# Patient Record
Sex: Male | Born: 1990 | Race: Black or African American | Hispanic: No | Marital: Single | Smoking: Current every day smoker
Health system: Southern US, Community
[De-identification: ages and names within clinical notes are randomized; demographics above are authoritative.]

## PROBLEM LIST (undated history)

## (undated) DIAGNOSIS — F329 Major depressive disorder, single episode, unspecified: Secondary | ICD-10-CM

## (undated) DIAGNOSIS — I10 Essential (primary) hypertension: Secondary | ICD-10-CM

## (undated) DIAGNOSIS — F3130 Bipolar disorder, current episode depressed, mild or moderate severity, unspecified: Secondary | ICD-10-CM

## (undated) DIAGNOSIS — F419 Anxiety disorder, unspecified: Secondary | ICD-10-CM

## (undated) DIAGNOSIS — F32A Depression, unspecified: Secondary | ICD-10-CM

---

## 2015-08-27 ENCOUNTER — Emergency Department
Admission: EM | Admit: 2015-08-27 | Discharge: 2015-08-28 | Disposition: A | Discharge status: Other Acute Inpatient Hospital | Payer: Self-pay | Attending: Student in an Organized Health Care Education/Training Program | Admitting: Student in an Organized Health Care Education/Training Program

## 2015-08-27 ENCOUNTER — Emergency Department
Admission: EM | Admit: 2015-08-27 | Discharge: 2015-08-27 | Disposition: A | Discharge status: Home / Self Care | Payer: Self-pay | Attending: Emergency Medicine | Admitting: Emergency Medicine

## 2015-08-27 ENCOUNTER — Encounter: Payer: Self-pay | Admitting: Emergency Medicine

## 2015-08-27 ENCOUNTER — Emergency Department: Payer: Self-pay

## 2015-08-27 DIAGNOSIS — I10 Essential (primary) hypertension: Secondary | ICD-10-CM

## 2015-08-27 DIAGNOSIS — F17200 Nicotine dependence, unspecified, uncomplicated: Secondary | ICD-10-CM | POA: Insufficient documentation

## 2015-08-27 DIAGNOSIS — F418 Other specified anxiety disorders: Secondary | ICD-10-CM | POA: Insufficient documentation

## 2015-08-27 DIAGNOSIS — Z79899 Other long term (current) drug therapy: Secondary | ICD-10-CM | POA: Insufficient documentation

## 2015-08-27 DIAGNOSIS — R45851 Suicidal ideations: Secondary | ICD-10-CM | POA: Insufficient documentation

## 2015-08-27 DIAGNOSIS — F1220 Cannabis dependence, uncomplicated: Secondary | ICD-10-CM

## 2015-08-27 DIAGNOSIS — G8929 Other chronic pain: Secondary | ICD-10-CM | POA: Insufficient documentation

## 2015-08-27 DIAGNOSIS — M25569 Pain in unspecified knee: Secondary | ICD-10-CM

## 2015-08-27 DIAGNOSIS — M25561 Pain in right knee: Secondary | ICD-10-CM | POA: Insufficient documentation

## 2015-08-27 DIAGNOSIS — R4689 Other symptoms and signs involving appearance and behavior: Secondary | ICD-10-CM

## 2015-08-27 DIAGNOSIS — F918 Other conduct disorders: Secondary | ICD-10-CM | POA: Insufficient documentation

## 2015-08-27 DIAGNOSIS — Z9119 Patient's noncompliance with other medical treatment and regimen: Secondary | ICD-10-CM | POA: Insufficient documentation

## 2015-08-27 HISTORY — DX: Major depressive disorder, single episode, unspecified: F32.9

## 2015-08-27 HISTORY — DX: Bipolar disorder, current episode depressed, mild or moderate severity, unspecified: F31.30

## 2015-08-27 HISTORY — DX: Anxiety disorder, unspecified: F41.9

## 2015-08-27 HISTORY — DX: Depression, unspecified: F32.A

## 2015-08-27 HISTORY — DX: Essential (primary) hypertension: I10

## 2015-08-27 LAB — COMPREHENSIVE METABOLIC PANEL
ALBUMIN: 5.1 g/dL — AB (ref 3.5–5.0)
ALT: 20 U/L (ref 17–63)
AST: 20 U/L (ref 15–41)
Alkaline Phosphatase: 47 U/L (ref 38–126)
Anion gap: 4 — ABNORMAL LOW (ref 5–15)
BUN: 13 mg/dL (ref 6–20)
CHLORIDE: 104 mmol/L (ref 101–111)
CO2: 31 mmol/L (ref 22–32)
Calcium: 9.9 mg/dL (ref 8.9–10.3)
Creatinine, Ser: 0.89 mg/dL (ref 0.61–1.24)
GFR calc Af Amer: >60 mL/min (ref >60)
GFR calc non Af Amer: >60 mL/min (ref >60)
GLUCOSE: 88 mg/dL (ref 65–99)
POTASSIUM: 3.8 mmol/L (ref 3.5–5.1)
Sodium: 139 mmol/L (ref 135–145)
Total Bilirubin: 0.9 mg/dL (ref 0.3–1.2)
Total Protein: 8.6 g/dL — ABNORMAL HIGH (ref 6.5–8.1)

## 2015-08-27 LAB — SALICYLATE LEVEL: Salicylate Lvl: <4 mg/dL (ref 2.8–30.0)

## 2015-08-27 LAB — CBC
HEMATOCRIT: 44.6 % (ref 40.0–52.0)
Hemoglobin: 14 g/dL (ref 13.0–18.0)
MCH: 27 pg (ref 26.0–34.0)
MCHC: 31.3 g/dL — ABNORMAL LOW (ref 32.0–36.0)
MCV: 86.3 fL (ref 80.0–100.0)
Platelets: 217 10*3/uL (ref 150–440)
RBC: 5.18 MIL/uL (ref 4.40–5.90)
RDW: 13.2 % (ref 11.5–14.5)
WBC: 9.5 10*3/uL (ref 3.8–10.6)

## 2015-08-27 LAB — ACETAMINOPHEN LEVEL: Acetaminophen (Tylenol), Serum: <10 ug/mL — ABNORMAL LOW (ref 10–30)

## 2015-08-27 LAB — ETHANOL: Alcohol, Ethyl (B): <5 mg/dL (ref <5)

## 2015-08-27 MED ORDER — HYDROMORPHONE HCL 1 MG/ML IJ SOLN
INTRAMUSCULAR | Status: AC
  Administered 2015-08-27: 1 mg via INTRAMUSCULAR
  Filled 2015-08-27: qty 1

## 2015-08-27 MED ORDER — HYDROMORPHONE HCL 1 MG/ML IJ SOLN
1.0000 mg | Freq: Once | INTRAMUSCULAR | Status: AC
Start: 2015-08-27 — End: 2015-08-27
  Administered 2015-08-27: 1 mg via INTRAMUSCULAR

## 2015-08-27 MED ORDER — MELOXICAM 15 MG PO TABS: 15.0000 mg | ORAL_TABLET | Freq: Every day | ORAL | 2 refills | Status: DC

## 2015-08-27 NOTE — ED Notes (Signed)
Patient brought to [facility] in street clothes and with cell phone. Butch RN present to change the patient and pack belongings.

## 2015-08-27 NOTE — ED Triage Notes (Signed)
Presents with pain to right knee  Describes pain as sharp and spasm like  Hx of same in past but states this time his knee is "locked" up

## 2015-08-27 NOTE — ED Provider Notes (Signed)
[facility] Emergency Department Provider Note  ____________________________________________   First MD Initiated Contact with Patient 08/27/15 1332     (approximate)  I have reviewed the triage vital signs and the nursing notes.   HISTORY  Chief Complaint Knee Pain   HPI Philip Norris is a 25 y.o. male is here complaining of right knee pain. Patient states that he has not had any recent injury. He has been having pain in his right knee for over 6 months. He states he has not seen anyone prior to this visit for his knee pain. He has not been taking any over-the-counter medication for his knee as he "does not like taking medication". He also discontinued taking his anxiety and depression medicine without discussion with his doctor as he decided he did not like taking that medication as well. He states that occasionally he does have some anxiety. 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 Currently he rates his pain as an 8/10. Pain is increased with weightbearing. Currently patient states that he cannot straighten his knee out without severe pain.   Past Medical History:  Diagnosis Date  . Anxiety   . Depression   . Hypertension     There are no active problems to display for this patient.   History reviewed. No pertinent surgical history.  Prior to Admission medications   Medication Sig Start Date End Date Taking? Authorizing Provider  meloxicam (MOBIC) 15 MG tablet Take 1 tablet (15 mg total) by mouth daily. 08/27/15 08/26/16  Tommi Rumpshonda L Summers, PA-C    Allergies Review of patient's allergies indicates no known allergies.  No family history on file.  Social History Social History  Substance Use Topics  . Smoking status: Current Every Day Smoker  . Smokeless tobacco: Never Used  . Alcohol use Yes    Review of Systems Constitutional: No fever/chills Cardiovascular: Denies chest pain. Respiratory: Denies shortness  of breath. Gastrointestinal: No abdominal pain.  No nausea, no vomiting.   Musculoskeletal: Negative for back pain. Positive for chronic right knee pain. Skin: Negative for rash. Neurological: Negative for headaches, focal weakness or numbness. Psychiatric:Positive for anxiety and depression.  10-point ROS otherwise negative.  ____________________________________________   PHYSICAL EXAM:  VITAL SIGNS: ED Triage Vitals  Enc Vitals Group     BP 08/27/15 1303 (!) 136/100     Pulse Rate 08/27/15 1303 77     Resp 08/27/15 1303 20     Temp 08/27/15 1303 98.7 F (37.1 C)     Temp Source 08/27/15 1303 Oral     SpO2 08/27/15 1303 99 %     Weight 08/27/15 1312 233 lb (105.7 kg)     Height 08/27/15 1312 6\' 3"  (1.905 m)     Head Circumference --      Peak Flow --      Pain Score 08/27/15 1301 8     Pain Loc --      Pain Edu? --      Excl. in GC? --     Constitutional: Alert and oriented. Well appearing and in no acute distress. Eyes: Conjunctivae are normal. PERRL. EOMI. Head: Atraumatic. Nose: No congestion/rhinnorhea. Neck: No stridor.   Cardiovascular: Normal rate, regular rhythm. Grossly normal heart sounds.  Good peripheral circulation. Respiratory: Normal respiratory effort.  No retractions. Lungs CTAB. Gastrointestinal: Soft and nontender. No distention. Musculoskeletal: On examination of the right knee currently patient has it flexed in his wheelchair. There is  tenderness on palpation of the knee posteriorly. No gross deformity is noted and no effusion is present anteriorly. Area is not warm or erythematous on exam. Patient currently is guarding movement. Neurologic:  Normal speech and language. No gross focal neurologic deficits are appreciated. No gait instability. Skin:  Skin is warm, dry and intact. No rash noted. No ecchymosis, erythema, or abrasions were noted. Psychiatric: Mood and affect are normal. Speech and behavior are  normal.  ____________________________________________   LABS (all labs ordered are listed, but only abnormal results are displayed)  Labs Reviewed - No data to display ____________________________________________  RADIOLOGY Per radiologist FINDINGS: No evidence of fracture, dislocation, or joint effusion. No evidence of arthropathy or other focal bone abnormality. Soft tissues are unremarkable.  I, Tommi Rumpshonda L Summers, personally viewed and evaluated these images (plain radiographs) as part of my medical decision making, as well as reviewing the written report by the radiologist. _______________________________________   PROCEDURES  Procedure(s) performed: None  Procedures  Critical Care performed: No  ____________________________________________   INITIAL IMPRESSION / ASSESSMENT AND PLAN / ED COURSE  Pertinent labs & imaging results that were available during my care of the patient were reviewed by me and considered in my medical decision making (see chart for details).    Clinical Course    Patient was given Dilaudid in the emergency room in order to get adequate x-rays of his right knee. Patient agrees to take medication for his right knee and meloxicam 15 mg 1 daily with food was given to him at no refill. Patient is to wear a knee immobilizer for support and make an appointment with Dr. Joice LoftsPoggi for his chronic knee pain. ____________________________________________   FINAL CLINICAL IMPRESSION(S) / ED DIAGNOSES  Final diagnoses:  Chronic knee pain, right      NEW MEDICATIONS STARTED DURING THIS VISIT:  New Prescriptions   MELOXICAM (MOBIC) 15 MG TABLET    Take 1 tablet (15 mg total) by mouth daily.     Note:  This document was prepared using Dragon voice recognition software and may include unintentional dictation errors.    Tommi RumpsRhonda L Summers, PA-C 08/27/15 1503    Philip Blazeravid Matthew Schaevitz, MD 08/27/15 [phone]

## 2015-08-27 NOTE — ED Notes (Signed)
Butch RN dressed pt out in bathroom. Pt unable to give UA sample at this time.  Pt aware of need and will give one as soon as possible

## 2015-08-27 NOTE — Discharge Instructions (Signed)
Follow-up with Dr. Joice LoftsPoggi if any continued problems. Call his office and make an appointment. Wear knee immobilizer for walking. He did not need to wear this while sleeping.

## 2015-08-27 NOTE — BH Assessment (Signed)
Assessment Note  Philip Norris is an 25 y.o. male. [facility].  He reports that he "cussed her out real good".  He states that he asked her for his medication, and she would not give it to him.  He reports that his mother and grandmother have an issue with him.  He reports that his grandmother was upset, and that this is what his family does when they get upset with him.  He states that his family likes to provoke him and then contact the police to get him.  He states that the family will make determination on what he should be doing.  He states that his family will provoke him and then make it seem as though he is the problem.  He states that he tries to stay away from them.  He states that his family does not listen to understand him.  He denied symptoms of depression or anxiety. He denied having auditory or visual hallucinations.  He denied suicidal or homicidal ideation or intent. He denied alcohol abuse. He reports the use of marijuana. He denied many stressors, but admits that he is stressed by his grandmother who treats him like a puppet. He states that he is currently working at [name] full time. IVC paperwork reports "Hist Bipolar disorder; non-compliance w/psych meds; has expressed desire to end his life. TTS contacted his mother Chilton Greathouse(Maye Riddick [phone]). She reports that [place] had come to the ED due to leg cramps earlier in the day.  He was discharged with an immobilizer and a prescription.   Mother reports that He has behavioral issues.  He is reported as having a "terrible outburst" this morning. She reports that these behaviors are sporadic.  She states that Philip Norris is not taking his medications. Mother states that he is "threatening and belligerent".    Diagnosis: Bipolar,   Past Medical History:  Past Medical History:  Diagnosis Date  . Anxiety   . Bipolar affect, depressed (HCC)   . Depression   . Hypertension     History  reviewed. No pertinent surgical history.  Family History: No family history on file.  Social History:  reports that he has been smoking.  He has never used smokeless tobacco. He reports that he drinks alcohol. His drug history is not on file.  Additional Social History:  Alcohol / Drug Use History of alcohol / drug use?: No history of alcohol / drug abuse  CIWA: CIWA-Ar BP: (!) 148/83 Pulse Rate: 82 COWS:    Allergies: No Known Allergies  Home Medications:  (Not in a hospital admission)  OB/GYN Status:  No LMP for male patient.  General Assessment Data [facility] Assessment: In system Is this a Tele or Face-to-Face Assessment?: Face-to-Face Is this an Initial Assessment or a Re-assessment for this encounter?: Initial Assessment Marital status: Single Maiden name: n/a Is patient pregnant?: No Pregnancy Status: No Living Arrangements: Parent, Other relatives Can pt return to current living arrangement?: Yes Admission Status: Involuntary Is patient capable of signing voluntary admission?: Yes Referral Source: Self/Family/Friend Insurance type: None  Medical Screening Exam [facility] Walk-in ONLY) Medical Exam completed: Yes  Crisis Care Plan Living Arrangements: Parent, Other relatives Legal Guardian: Other: (Self) Name of Psychiatrist: [facility] Name of Therapist: [facility]  Education Status Is patient currently in school?: No Current Grade: n/a Highest grade of school patient has completed: 9th Name of school: [employer] Contact person: n/a  Risk to self with the past 6 months Suicidal Ideation: No Has patient been a risk to self within the past 6 months prior to admission? : No Suicidal Intent: No Has patient had any suicidal intent within the past 6 months prior to admission? : No Is patient at risk for suicide?: No Suicidal Plan?: No Has patient had any suicidal plan within the past 6 months prior to admission? : No Access to Means: No What has  been your use of drugs/alcohol within the last 12 months?: Use of marijuana Previous Attempts/Gestures: No How many times?: 0 Other Self Harm Risks: denied Triggers for Past Attempts: None known Intentional Self Injurious Behavior: None Family Suicide History: No Recent stressful life event(s): Conflict (Comment) (family stressors) Persecutory voices/beliefs?: No Depression: No Depression Symptoms:  (denied) Substance abuse history and/or treatment for substance abuse?: No Suicide prevention information given to non-admitted patients: Not applicable  Risk to Others within the past 6 months Homicidal Ideation: No Does patient have any lifetime risk of violence toward others beyond the six months prior to admission? : No Thoughts of Harm to Others: No Current Homicidal Intent: No Current Homicidal Plan: No Access to Homicidal Means: No Identified Victim: none History of harm to others?: No Assessment of Violence: None Noted Violent Behavior Description: denied Does patient have access to weapons?: No Criminal Charges Pending?: No Does patient have a court date: No Is patient on probation?: No  Psychosis Hallucinations: None noted Delusions: None noted  Mental Status Report Appearance/Hygiene: In scrubs, Unremarkable Eye Contact: Good Motor Activity: Unremarkable Speech: Logical/coherent Level of Consciousness: Alert Mood: Irritable Affect: Appropriate to circumstance Anxiety Level: None Thought Processes: Coherent Judgement: Unimpaired Orientation: Person, Place, Time, Situation Obsessive Compulsive Thoughts/Behaviors: None  Cognitive Functioning Concentration: Normal Memory: Recent Intact IQ: Average Insight: Fair Impulse Control: Fair Appetite: Fair Sleep: No Change Vegetative Symptoms: None  ADLScreening [facility] Assessment Services) Patient's cognitive ability adequate to safely complete daily activities?: Yes Patient able to express need for assistance with  ADLs?: Yes Independently performs ADLs?: Yes (appropriate for developmental age)  Prior Inpatient Therapy Prior Inpatient Therapy: No Prior Therapy Dates: n/a Prior Therapy Facilty/Provider(s): n/a Reason for Treatment: n/a  Prior Outpatient Therapy Prior Outpatient Therapy: Yes Prior Therapy Dates: Current Prior Therapy Facilty/Provider(s): [facility] Reason for Treatment: anxiety, Bipolar, schizophrenia Does patient have an ACCT team?: No Does patient have Intensive In-House Services?  : No Does patient have [facility] services? : No Does patient have P4CC services?: No  ADL Screening (condition at time of admission) Patient's cognitive ability adequate to safely complete daily activities?: Yes Patient able to express need for assistance with ADLs?: Yes Independently performs ADLs?: Yes (appropriate for developmental age)       Abuse/Neglect Assessment (Assessment to be complete while patient is alone) Physical Abuse: Denies Verbal Abuse: Denies Sexual Abuse: Denies Exploitation of patient/patient's resources: Denies Self-Neglect: Denies     Merchant navy officerAdvance Directives (For Healthcare) Does patient have an advance directive?: No Would patient like information on creating an advanced directive?: No - patient declined information    Additional Information 1:1 In Past 12 Months?: No CIRT Risk: No Elopement Risk: No Does patient have medical clearance?: Yes     Disposition:  Disposition Initial Assessment Completed for this Encounter: Yes Disposition of Patient: Other dispositions  On Site Evaluation by:   Reviewed with Physician:    Justice DeedsKeisha Malike Foglio 08/27/2015 10:31 PM

## 2015-08-27 NOTE — ED Triage Notes (Signed)
Pt presents to ED via wheelchair with [place] [employer]. Pt is under IVC, pt has hx of bipolar disorder. Pt is non-compliant with psych meds, pt reports was "yelling and cussing at grandmother." Pt calm and cooperative at this time. Pt denies SI or HI at this time.

## 2015-08-27 NOTE — ED Notes (Signed)
[facility]

## 2015-08-27 NOTE — ED Provider Notes (Signed)
[facility] Emergency Department Provider Note    First MD Initiated Contact with Patient 08/27/15 2117     (approximate)  I have reviewed the triage vital signs and the nursing notes.   HISTORY  Chief Complaint Psychiatric Evaluation    HPI Philip Norris is a 25 y.o. male with a history of bipolar disorder depression and anxiety presents as an IVC after having aggressive behavior towards his grandmother tonight. Patient states he does not take any antipsychotic medications currently denies any suicidal ideation or homicidal ideation. States he's been off his medications for several months. States he does not like the way they make him feel. He denies any hallucinations. States that he was angry towards his grandmother today and she he felt that she was being "petty" and tried to divert his medications that he was prescribed for knee pain after being seen in the ER today. That he was yelling at her but did not become physically aggressive with her.   Past Medical History:  Diagnosis Date  . Anxiety   . Bipolar affect, depressed (HCC)   . Depression   . Hypertension     There are no active problems to display for this patient.   History reviewed. No pertinent surgical history.  Prior to Admission medications   Medication Sig Start Date End Date Taking? Authorizing Provider  meloxicam (MOBIC) 15 MG tablet Take 1 tablet (15 mg total) by mouth daily. 08/27/15 08/26/16  Tommi Rumpshonda L Summers, PA-C    Allergies Review of patient's allergies indicates no known allergies.  No family history on file.  Social History Social History  Substance Use Topics  . Smoking status: Current Every Day Smoker  . Smokeless tobacco: Never Used  . Alcohol use Yes    Review of Systems Patient denies headaches, rhinorrhea, blurry vision, numbness, shortness of breath, chest pain, edema, cough, abdominal pain, nausea, vomiting, diarrhea, dysuria, fevers, rashes or  hallucinations unless otherwise stated above in HPI. ____________________________________________   PHYSICAL EXAM:  VITAL SIGNS: Vitals:   08/27/15 2102  BP: (!) 148/83  Pulse: 82  Resp: 18  Temp: 98.8 F (37.1 C)    Constitutional: Alert and oriented. Well appearing and in no acute distress. Eyes: Conjunctivae are normal. PERRL. EOMI. Head: Atraumatic. Nose: No congestion/rhinnorhea. Mouth/Throat: Mucous membranes are moist.  Oropharynx non-erythematous. Neck: No stridor. Painless ROM. No cervical spine tenderness to palpation Hematological/Lymphatic/Immunilogical: No cervical lymphadenopathy. Cardiovascular: Normal rate, regular rhythm. Grossly normal heart sounds.  Good peripheral circulation. Respiratory: Normal respiratory effort.  No retractions. Lungs CTAB. Gastrointestinal: Soft and nontender. No distention. No abdominal bruits. No CVA tenderness. Genitourinary:  Musculoskeletal: No lower extremity tenderness nor edema.  No joint effusions. Neurologic:  Normal speech and language. No gross focal neurologic deficits are appreciated. No gait instability. Skin:  Skin is warm, dry and intact. No rash noted. Psychiatric: Mood and affect are normal. Speech and behavior are normal.  ____________________________________________   LABS (all labs ordered are listed, but only abnormal results are displayed)  Results for orders placed or performed during the hospital encounter of 08/27/15 (from the past 24 hour(s))  Comprehensive metabolic panel     Status: Abnormal   Collection Time: 08/27/15  9:30 PM  Result Value Ref Range   Sodium 139 135 - 145 mmol/L   Potassium 3.8 3.5 - 5.1 mmol/L   Chloride 104 101 - 111 mmol/L   CO2 31 22 - 32 mmol/L   Glucose, Bld 88 65 - 99 mg/dL  BUN 13 6 - 20 mg/dL   Creatinine, Ser 1.610.89 0.61 - 1.24 mg/dL   Calcium 9.9 8.9 - 09.610.3 mg/dL   Total Protein 8.6 (H) 6.5 - 8.1 g/dL   Albumin 5.1 (H) 3.5 - 5.0 g/dL   AST 20 15 - 41 U/L   ALT 20 17  - 63 U/L   Alkaline Phosphatase 47 38 - 126 U/L   Total Bilirubin 0.9 0.3 - 1.2 mg/dL   GFR calc non Af Amer >60 >60 mL/min   GFR calc Af Amer >60 >60 mL/min   Anion gap 4 (L) 5 - 15  cbc     Status: Abnormal   Collection Time: 08/27/15  9:30 PM  Result Value Ref Range   WBC 9.5 3.8 - 10.6 K/uL   RBC 5.18 4.40 - 5.90 MIL/uL   Hemoglobin 14.0 13.0 - 18.0 g/dL   HCT 04.544.6 40.940.0 - 81.152.0 %   MCV 86.3 80.0 - 100.0 fL   MCH 27.0 26.0 - 34.0 pg   MCHC 31.3 (L) 32.0 - 36.0 g/dL   RDW 91.413.2 78.211.5 - 95.614.5 %   Platelets 217 150 - 440 K/uL   ____________________________________________  ________________________________   ____________________________________________   PROCEDURES  Procedure(s) performed: none    Critical Care performed: no ____________________________________________   INITIAL IMPRESSION / ASSESSMENT AND PLAN / ED COURSE  Pertinent labs & imaging results that were available during my care of the patient were reviewed by me and considered in my medical decision making (see chart for details).  DDX: SI, HI, aggressive behavior, psychosis,  Philip LombardQuintin Thome is a 25 y.o. who presents to the ED with aggressive behavior and history of bipolar disorder. Based on his history agitated behavior and aggression towards family members I will consult psychiatry for further evaluation and management. Do not feel that his presentation represents an underlying organic process at this time. Patient otherwise medically clear for psychiatric admission.  Have discussed with the patient and available family all diagnostics and treatments performed thus far and all questions were answered to the best of my ability. The patient demonstrates understanding and agreement with plan.   Clinical Course     ____________________________________________   FINAL CLINICAL IMPRESSION(S) / ED DIAGNOSES  Final diagnoses:  Aggressive behavior  Suicidal ideation      NEW MEDICATIONS STARTED  DURING THIS VISIT:  New Prescriptions   No medications on file     Note:  This document was prepared using Dragon voice recognition software and may include unintentional dictation errors.    Willy EddyPatrick Shatira Dobosz, MD 08/28/15 [phone]

## 2015-08-28 ENCOUNTER — Inpatient Hospital Stay
Admission: EM | Admit: 2015-08-28 | Discharge: 2015-08-30 | DRG: 885 | Disposition: A | Discharge status: Home / Self Care | Payer: Self-pay | Source: Intra-hospital | Attending: Psychiatry | Admitting: Psychiatry

## 2015-08-28 DIAGNOSIS — Z9119 Patient's noncompliance with other medical treatment and regimen: Secondary | ICD-10-CM

## 2015-08-28 DIAGNOSIS — M25569 Pain in unspecified knee: Secondary | ICD-10-CM | POA: Diagnosis present

## 2015-08-28 DIAGNOSIS — F1220 Cannabis dependence, uncomplicated: Secondary | ICD-10-CM | POA: Diagnosis present

## 2015-08-28 DIAGNOSIS — F3130 Bipolar disorder, current episode depressed, mild or moderate severity, unspecified: Secondary | ICD-10-CM | POA: Diagnosis present

## 2015-08-28 DIAGNOSIS — F319 Bipolar disorder, unspecified: Principal | ICD-10-CM | POA: Diagnosis present

## 2015-08-28 DIAGNOSIS — G4700 Insomnia, unspecified: Secondary | ICD-10-CM | POA: Diagnosis present

## 2015-08-28 DIAGNOSIS — F17200 Nicotine dependence, unspecified, uncomplicated: Secondary | ICD-10-CM | POA: Diagnosis present

## 2015-08-28 DIAGNOSIS — F17210 Nicotine dependence, cigarettes, uncomplicated: Secondary | ICD-10-CM | POA: Diagnosis present

## 2015-08-28 DIAGNOSIS — I10 Essential (primary) hypertension: Secondary | ICD-10-CM | POA: Diagnosis present

## 2015-08-28 LAB — URINE DRUG SCREEN, QUALITATIVE (ARMC ONLY)
Amphetamines, Ur Screen: NOT DETECTED
BARBITURATES, UR SCREEN: NOT DETECTED
BENZODIAZEPINE, UR SCRN: NOT DETECTED
Cannabinoid 50 Ng, Ur ~~LOC~~: POSITIVE — AB
Cocaine Metabolite,Ur ~~LOC~~: NOT DETECTED
MDMA (Ecstasy)Ur Screen: NOT DETECTED
Methadone Scn, Ur: NOT DETECTED
OPIATE, UR SCREEN: NOT DETECTED
PHENCYCLIDINE (PCP) UR S: NOT DETECTED
Tricyclic, Ur Screen: NOT DETECTED

## 2015-08-28 MED ORDER — MELOXICAM 7.5 MG PO TABS
15.0000 mg | ORAL_TABLET | Freq: Every day | ORAL | Status: DC
Start: 2015-08-28 — End: 2015-08-28
  Administered 2015-08-28 (×2): 15 mg via ORAL
  Filled 2015-08-28 (×2): qty 2

## 2015-08-28 MED ORDER — ACETAMINOPHEN 325 MG PO TABS
650.0000 mg | ORAL_TABLET | Freq: Four times a day (QID) | ORAL | Status: DC | PRN
  Administered 2015-08-29 (×2): 650 mg via ORAL
  Filled 2015-08-28 (×2): qty 2

## 2015-08-28 MED ORDER — ALUM & MAG HYDROXIDE-SIMETH 200-200-20 MG/5ML PO SUSP: 30.0000 mL | ORAL | Status: DC | PRN

## 2015-08-28 MED ORDER — MAGNESIUM HYDROXIDE 400 MG/5ML PO SUSP: 30.0000 mL | Freq: Every day | ORAL | Status: DC | PRN

## 2015-08-28 MED ORDER — ACETAMINOPHEN 325 MG PO TABS
650.0000 mg | ORAL_TABLET | ORAL | Status: DC | PRN
  Administered 2015-08-28 (×2): 650 mg via ORAL
  Filled 2015-08-28 (×2): qty 2

## 2015-08-28 MED ORDER — HYDROXYZINE HCL 25 MG PO TABS
25.0000 mg | ORAL_TABLET | Freq: Four times a day (QID) | ORAL | Status: DC | PRN
  Administered 2015-08-28 (×2): 25 mg via ORAL
  Filled 2015-08-28 (×2): qty 1

## 2015-08-28 NOTE — Consult Note (Signed)
[facility] Face-to-Face Psychiatry Consult   Reason for Consult:  Consult for this 25 year old man brought to the hospital under IVC because of aggression at home. Referring Physician:  Eual Fines Patient Identification: Philip Norris MRN:  [id number] Principal Diagnosis: Bipolar affect, depressed ([place]) Diagnosis:   Patient Active Problem List   Diagnosis Date Noted  . Bipolar affect, depressed ([place]) [F31.30] 08/28/2015  . Hypertension [I10] 08/28/2015  . Cannabis abuse [F12.10] 08/28/2015  . Knee pain [M25.569] 08/28/2015    Total Time spent with patient: 1 hour  Subjective:   Philip Norris is a 25 y.o. male patient admitted with "my grandmother sent me here".  HPI:  Patient interviewed. Chart reviewed. Labs and vitals reviewed. This 25 year old man with a history of mood problems came to the emergency room yesterday for acute pain in his knee. He was given some nonsteroidal medicine and sent home. At home apparently he got into an argument with his grandmother over how much of the pain medicine she would give him. He reportedly lost his temper and became very hostile and verbally abusive towards his grandmother. Patient denies that he was physically aggressive. Denies that he made any threats to anyone and denies any suicidal thoughts. He admits that his mood is up and down at times and that he can lose his temper easily. He is not currently taking any psychiatric medicine. He says that his last drink of alcohol was a couple weeks ago but he uses marijuana on a daily basis. The commitment paperwork describes him as having a diagnosis of bipolar disorder and saying that he is labile and has been noncompliant with medication.  Social history: Lives with his grandmother. Says that he works at [employer] at night. Dropped out of school in ninth grade.  Medical history: No known medical problems other than a past diagnosis of hypertension.  Substance abuse history: History of daily use  of marijuana. Denies other drugs. He drinks alcohol but his last drink was a couple weeks ago.  Past Psychiatric History: Patient has been going to [place] and seeing Dr. Jacqualine Code but hasn't followed up in a couple months. History of a diagnosis of bipolar disorder. Unclear which medicines he has been on in the past. He tells me trazodone was the only thing he was taking although there is some report in some of the paperwork that he had been on Depakote at one point. Denies any history of suicide attempts.  Risk to Self: Suicidal Ideation: No Suicidal Intent: No Is patient at risk for suicide?: No Suicidal Plan?: No Access to Means: No What has been your use of drugs/alcohol within the last 12 months?: Use of marijuana How many times?: 0 Other Self Harm Risks: denied Triggers for Past Attempts: None known Intentional Self Injurious Behavior: None Risk to Others: Homicidal Ideation: No Thoughts of Harm to Others: No Current Homicidal Intent: No Current Homicidal Plan: No Access to Homicidal Means: No Identified Victim: none History of harm to others?: No Assessment of Violence: None Noted Violent Behavior Description: denied Does patient have access to weapons?: No Criminal Charges Pending?: No Does patient have a court date: No Prior Inpatient Therapy: Prior Inpatient Therapy: No Prior Therapy Dates: n/a Prior Therapy Facilty/Provider(s): n/a Reason for Treatment: n/a Prior Outpatient Therapy: Prior Outpatient Therapy: Yes Prior Therapy Dates: Current Prior Therapy Facilty/Provider(s): [facility] Reason for Treatment: anxiety, Bipolar, schizophrenia Does patient have an ACCT team?: No Does patient have Intensive In-House Services?  : No Does patient have [facility] services? :  No Does patient have P4CC services?: No  Past Medical History:  Past Medical History:  Diagnosis Date  . Anxiety   . Bipolar affect, depressed ([place])   . Depression   . Hypertension    History reviewed. No  pertinent surgical history. Family History: No family history on file. Family Psychiatric  History: Patient says he has a history of depression but no history of suicide in the family Social History:  History  Alcohol Use  . Yes     History  Drug use: Unknown    Social History   Social History  . Marital status: Single    Spouse name: N/A  . Number of children: N/A  . Years of education: N/A   Social History Main Topics  . Smoking status: Current Every Day Smoker  . Smokeless tobacco: Never Used  . Alcohol use Yes  . Drug use: Unknown  . Sexual activity: Not Asked   Other Topics Concern  . None   Social History Narrative  . None   Additional Social History:    Allergies:  No Known Allergies  Labs:  Results for orders placed or performed during the hospital encounter of 08/27/15 (from the past 48 hour(s))  Comprehensive metabolic panel     Status: Abnormal   Collection Time: 08/27/15  9:30 PM  Result Value Ref Range   Sodium 139 135 - 145 mmol/L   Potassium 3.8 3.5 - 5.1 mmol/L   Chloride 104 101 - 111 mmol/L   CO2 31 22 - 32 mmol/L   Glucose, Bld 88 65 - 99 mg/dL   BUN 13 6 - 20 mg/dL   Creatinine, Ser 0.89 0.61 - 1.24 mg/dL   Calcium 9.9 8.9 - 10.3 mg/dL   Total Protein 8.6 (H) 6.5 - 8.1 g/dL   Albumin 5.1 (H) 3.5 - 5.0 g/dL   AST 20 15 - 41 U/L   ALT 20 17 - 63 U/L   Alkaline Phosphatase 47 38 - 126 U/L   Total Bilirubin 0.9 0.3 - 1.2 mg/dL   GFR calc non Af Amer >60 >60 mL/min   GFR calc Af Amer >60 >60 mL/min    Comment: (NOTE) The eGFR has been calculated using the CKD EPI equation. This calculation has not been validated in all clinical situations. eGFR's persistently <60 mL/min signify possible Chronic Kidney Disease.    Anion gap 4 (L) 5 - 15  Ethanol     Status: None   Collection Time: 08/27/15  9:30 PM  Result Value Ref Range   Alcohol, Ethyl (B) <5 <5 mg/dL    Comment:        LOWEST DETECTABLE LIMIT FOR SERUM ALCOHOL IS 5 mg/dL FOR  MEDICAL PURPOSES ONLY   Salicylate level     Status: None   Collection Time: 08/27/15  9:30 PM  Result Value Ref Range   Salicylate Lvl <1.0 2.8 - 30.0 mg/dL  Acetaminophen level     Status: Abnormal   Collection Time: 08/27/15  9:30 PM  Result Value Ref Range   Acetaminophen (Tylenol), Serum <10 (L) 10 - 30 ug/mL    Comment:        THERAPEUTIC CONCENTRATIONS VARY SIGNIFICANTLY. A RANGE OF 10-30 ug/mL MAY BE AN EFFECTIVE CONCENTRATION FOR MANY PATIENTS. HOWEVER, SOME ARE BEST TREATED AT CONCENTRATIONS OUTSIDE THIS RANGE. ACETAMINOPHEN CONCENTRATIONS >150 ug/mL AT 4 HOURS AFTER INGESTION AND >50 ug/mL AT 12 HOURS AFTER INGESTION ARE OFTEN ASSOCIATED WITH TOXIC REACTIONS.   cbc  Status: Abnormal   Collection Time: 08/27/15  9:30 PM  Result Value Ref Range   WBC 9.5 3.8 - 10.6 K/uL   RBC 5.18 4.40 - 5.90 MIL/uL   Hemoglobin 14.0 13.0 - 18.0 g/dL   HCT 44.6 40.0 - 52.0 %   MCV 86.3 80.0 - 100.0 fL   MCH 27.0 26.0 - 34.0 pg   MCHC 31.3 (L) 32.0 - 36.0 g/dL   RDW 13.2 11.5 - 14.5 %   Platelets 217 150 - 440 K/uL  Urine Drug Screen, Qualitative     Status: Abnormal   Collection Time: 08/28/15  2:18 PM  Result Value Ref Range   Tricyclic, Ur Screen NONE DETECTED NONE DETECTED   Amphetamines, Ur Screen NONE DETECTED NONE DETECTED   MDMA (Ecstasy)Ur Screen NONE DETECTED NONE DETECTED   Cocaine Metabolite,Ur [place] NONE DETECTED NONE DETECTED   Opiate, Ur Screen NONE DETECTED NONE DETECTED   Phencyclidine (PCP) Ur S NONE DETECTED NONE DETECTED   Cannabinoid 50 Ng, Ur [place] POSITIVE (A) NONE DETECTED   Barbiturates, Ur Screen NONE DETECTED NONE DETECTED   Benzodiazepine, Ur Scrn NONE DETECTED NONE DETECTED   Methadone Scn, Ur NONE DETECTED NONE DETECTED    Comment: (NOTE) 540  Tricyclics, urine               Cutoff 1000 ng/mL 200  Amphetamines, urine             Cutoff 1000 ng/mL 300  MDMA (Ecstasy), urine           Cutoff 500 ng/mL 400  Cocaine Metabolite, urine       Cutoff  300 ng/mL 500  Opiate, urine                   Cutoff 300 ng/mL 600  Phencyclidine (PCP), urine      Cutoff 25 ng/mL 700  Cannabinoid, urine              Cutoff 50 ng/mL 800  Barbiturates, urine             Cutoff 200 ng/mL 900  Benzodiazepine, urine           Cutoff 200 ng/mL 1000 Methadone, urine                Cutoff 300 ng/mL 1100 1200 The urine drug screen provides only a preliminary, unconfirmed 1300 analytical test result and should not be used for non-medical 1400 purposes. Clinical consideration and professional judgment should 1500 be applied to any positive drug screen result due to possible 1600 interfering substances. A more specific alternate chemical method 1700 must be used in order to obtain a confirmed analytical result.  1800 Gas chromato graphy / mass spectrometry (GC/MS) is the preferred 1900 confirmatory method.     Current Facility-Administered Medications  Medication Dose Route Frequency Provider Last Rate Last Dose  . acetaminophen (TYLENOL) tablet 650 mg  650 mg Oral Q4H PRN Merlyn Lot, MD   650 mg at 08/28/15 0217  . hydrOXYzine (ATARAX/VISTARIL) tablet 25 mg  25 mg Oral Q6H PRN Merlyn Lot, MD   25 mg at 08/28/15 0217  . meloxicam (MOBIC) tablet 15 mg  15 mg Oral Daily Merlyn Lot, MD   15 mg at 08/28/15 9811   Current Outpatient Prescriptions  Medication Sig Dispense Refill  . meloxicam (MOBIC) 15 MG tablet Take 1 tablet (15 mg total) by mouth daily. 30 tablet 2    Musculoskeletal: Strength & Muscle Tone: within normal  limits Gait & Station: normal Patient leans: N/A  Psychiatric Specialty Exam: Physical Exam  Nursing note and vitals reviewed. Constitutional: He appears well-developed and well-nourished.  HENT:  Head: Normocephalic and atraumatic.  Eyes: Conjunctivae are normal. Pupils are equal, round, and reactive to light.  Neck: Normal range of motion.  Cardiovascular: Regular rhythm and normal heart sounds.   Respiratory:  Effort normal. No respiratory distress.  GI: Soft.  Musculoskeletal: Normal range of motion.  Neurological: He is alert.  Skin: Skin is warm and dry.  Psychiatric: His affect is inappropriate. His speech is delayed. He is slowed. He expresses impulsivity. He expresses suicidal ideation. He exhibits abnormal recent memory.    Review of Systems  Constitutional: Negative.   HENT: Negative.   Eyes: Negative.   Respiratory: Negative.   Cardiovascular: Negative.   Gastrointestinal: Negative.   Musculoskeletal: Positive for joint pain.  Skin: Negative.   Neurological: Negative.   Psychiatric/Behavioral: Positive for substance abuse. Negative for depression, hallucinations and suicidal ideas. The patient is not nervous/anxious.     Blood pressure (!) 135/93, pulse 70, temperature 98.2 F (36.8 C), temperature source Oral, resp. rate 16, height '6\' 3"'  (1.905 m), weight 105.7 kg (233 lb), SpO2 98 %.Body mass index is 29.12 kg/m.  General Appearance: Casual  Eye Contact:  Fair  Speech:  Slow  Volume:  Decreased  Mood:  Euthymic  Affect:  Inappropriate  Thought Process:  Goal Directed  Orientation:  Full (Time, Place, and Person)  Thought Content:  Logical  Suicidal Thoughts:  No  Homicidal Thoughts:  No  Memory:  Immediate;   Good Recent;   Good Remote;   Fair  Judgement:  Impaired  Insight:  Fair  Psychomotor Activity:  Decreased  Concentration:  Concentration: Fair  Recall:  [employer] of Knowledge:  Fair  Language:  Fair  Akathisia:  No  Handed:  Right  AIMS (if indicated):     Assets:  Communication Skills Housing Physical Health Resilience Social Support  ADL's:  Intact  Cognition:  WNL  Sleep:        Treatment Plan Summary: Medication management and Plan This is a 25 year old man with Center under involuntary commitment for losing his temper and getting aggressive at home. Patient has denied having any suicidal or homicidal ideation but admits that he's been  noncompliant with his medicine. His affect appears rather flippant and at times odd. The paperwork describes his behavior much more dramatically than he did. After some consideration of decided to hold the commitment and admit and admit the patient to the psychiatric ward. This was in part because he told me that he would refuse to take any medicine for bipolar disorder. Orders completed. Admit to the hospital. When necessary medicine. Full treatment team can decide on further plan.  Disposition: Recommend psychiatric Inpatient admission when medically cleared.  Alethia Berthold, MD 08/28/2015 5:13 PM

## 2015-08-28 NOTE — Progress Notes (Signed)
Pt arrives on unit from ED. Skin assessment performed and no contraband found. Pt has a knee brace on his right knee, which he reports is due to his legs "locking up." Brace checked by security and nursing staff. No skin abnormalities noted. Pt remains free from harm. Will continue to monitor.

## 2015-08-28 NOTE — ED Notes (Signed)
[place] documentation in error.  Wrong patient.

## 2015-08-28 NOTE — ED Notes (Signed)
Pt given [place] sandwich tray and juice.

## 2015-08-28 NOTE — ED Notes (Signed)
[facility]

## 2015-08-28 NOTE — ED Notes (Signed)
Pt requesting something for pain at this time 

## 2015-08-28 NOTE — ED Notes (Signed)
SOC completed. Dr. Christella HartiganJacobs verbal report for IVC to Continue, Inpatient pysch, and to start vistaril 25mg  Q6hr PRN. Dr. Roxan Hockeyobinson made aware at this time.

## 2015-08-28 NOTE — ED Notes (Signed)
Pt is sleeping no distress noted. 

## 2015-08-28 NOTE — ED Notes (Signed)
SOC consult in progress.  

## 2015-08-28 NOTE — ED Notes (Signed)
Pt given breakfast tray

## 2015-08-28 NOTE — ED Notes (Signed)
Pt sleeping. 

## 2015-08-28 NOTE — ED Notes (Signed)
Pt unable to move to BHU. Pt unable to walk without assist, pt wearing a knee immobilzer for right knee pain states it locks up on him, pt uses w/c for mobility. Pt states pain 10/10 to right knee. Pt in bed resting no distress noted. Pt states cant urinate right now will later. Denies SI/HI. Pt is awaiting inpatient psych placement.

## 2015-08-28 NOTE — BH Assessment (Signed)
[facility] by Dr. Toni Amendlapacs.  Attending Physician will be Dr. Jennet MaduroPucilowska.   Patient [facility] Gwen.   Intake Paper Work has been signed and placed on patient chart.  ER staff is aware of the admission Philip Norris(Glenda, ER Sect.; Dr. Scotty CourtStafford, ER MD; Erskine SquibbJane Patient's Nurse & Byrd HesselbachMaria, Patient Access).

## 2015-08-28 NOTE — ED Notes (Signed)
After pt stated he would take tylenol, pt refused it saying it does not work. Pt asked for Aleve, instead. Will speak to EDP regarding ordering this medication.

## 2015-08-28 NOTE — ED Notes (Signed)
Patient's grandmother Philip Norris:  [phone]

## 2015-08-28 NOTE — ED Notes (Signed)
Spoke with patient's grandmother with patient's consent.  Informed of plan to admit patient to inpatient psych.  Grandmother stated she will visit patient tomorrow.  Visiting hours discussed.

## 2015-08-29 DIAGNOSIS — F17200 Nicotine dependence, unspecified, uncomplicated: Secondary | ICD-10-CM | POA: Diagnosis present

## 2015-08-29 DIAGNOSIS — F3130 Bipolar disorder, current episode depressed, mild or moderate severity, unspecified: Secondary | ICD-10-CM

## 2015-08-29 MED ORDER — TRAZODONE HCL 100 MG PO TABS
100.0000 mg | ORAL_TABLET | Freq: Every day | ORAL | Status: DC
  Administered 2015-08-29: 100 mg via ORAL
  Filled 2015-08-29: qty 1

## 2015-08-29 MED ORDER — RISPERIDONE 1 MG PO TABS
2.0000 mg | ORAL_TABLET | Freq: Two times a day (BID) | ORAL | Status: DC
  Administered 2015-08-29 – 2015-08-30 (×3): 2 mg via ORAL
  Filled 2015-08-29 (×3): qty 2

## 2015-08-29 MED ORDER — OXCARBAZEPINE 300 MG/5ML PO SUSP
300.0000 mg | Freq: Two times a day (BID) | ORAL | Status: DC
  Administered 2015-08-29 – 2015-08-30 (×2): 300 mg via ORAL
  Filled 2015-08-29 (×4): qty 5

## 2015-08-29 NOTE — BHH Suicide Risk Assessment (Signed)
[facility]   Nursing information obtained from:    Demographic factors:    Current Mental Status:    Loss Factors:    Historical Factors:    Risk Reduction Factors:     Total Time spent with patient: 1 hour Principal Problem: Bipolar I disorder, most recent episode depressed ([facility]) Diagnosis:   Patient Active Problem List   Diagnosis Date Noted  . Bipolar I disorder, most recent episode depressed ([facility]) [F31.30] 08/28/2015    Priority: High  . Tobacco use disorder [F17.200] 08/29/2015  . Hypertension [I10] 08/28/2015  . Cannabis use disorder, moderate, dependence ([facility]) [F12.20] 08/28/2015  . Knee pain [M25.569] 08/28/2015   Subjective Data: agitation.  Continued Clinical Symptoms:    The "Alcohol Use Disorders Identification Test", Guidelines for Use in Primary Care, Second Edition.  World Science writerHealth Organization [facility](WHO). Score between 0-7:  no or low risk or alcohol related problems. Score between 8-15:  moderate risk of alcohol related problems. Score between 16-19:  high risk of alcohol related problems. Score 20 or above:  warrants further diagnostic evaluation for alcohol dependence and treatment.   CLINICAL FACTORS:   Bipolar Disorder:   Depressive phase Alcohol/Substance Abuse/Dependencies   Musculoskeletal: Strength & Muscle Tone: within normal limits Gait & Station: normal Patient leans: N/A  Psychiatric Specialty Exam: Physical Exam  Nursing note and vitals reviewed.   Review of Systems  Musculoskeletal: Positive for joint pain.  Psychiatric/Behavioral: Positive for depression, hallucinations and substance abuse.  All other systems reviewed and are negative.   Blood pressure (!) 152/88, pulse 71, temperature 98 F (36.7 C), temperature source Oral, resp. rate 18, height 6\' 3"  (1.905 m), weight 105.7 kg (233 lb), SpO2 100 %.Body mass index is 29.12 kg/m.  General Appearance: Casual  Eye Contact:  Good  Speech:  Clear and Coherent   Volume:  Normal  Mood:  Anxious  Affect:  Appropriate  Thought Process:  Goal Directed  Orientation:  Full (Time, Place, and Person)  Thought Content:  Hallucinations: Auditory  Suicidal Thoughts:  No  Homicidal Thoughts:  No  Memory:  Immediate;   Fair Recent;   Fair Remote;   Fair  Judgement:  Impaired  Insight:  Shallow  Psychomotor Activity:  Normal  Concentration:  Concentration: Fair and Attention Span: Fair  Recall:  [employer]  Fund of Knowledge:  Fair  Language:  Fair  Akathisia:  No  Handed:  Right  AIMS (if indicated):     Assets:  Communication Skills Desire for Improvement Housing Physical Health Resilience  ADL's:  Intact  Cognition:  WNL  Sleep:  Number of Hours: 4      COGNITIVE FEATURES THAT CONTRIBUTE TO RISK:  None    SUICIDE RISK:   Moderate:  Frequent suicidal ideation with limited intensity, and duration, some specificity in terms of plans, no associated intent, good self-control, limited dysphoria/symptomatology, some risk factors present, and identifiable protective factors, including available and accessible social support.   PLAN OF CARE: Hospital admission, medication management, substance abuse counseling, discharge planning.  [name] is a 25 year old male with history of depression and what is the ability admitted for agitated, threatening behavior in the context of treatment noncompliance.  1. Agitation. This has resolved.  2. Mood. We will start the patient on Trileptal and Risperdal for mood stabilization.  3. Insomnia. We'll offer trazodone.  4. Smoking. Nicotine patch is available.  5. Marijuana use. The patient minimizes his problems and declines treatment.  6. Knee pain.  This seems to be a chronic issue. He is neat was x-rayed in the emergency room and results were unremarkable. He is not allowed to use a brace on the unit. We will ask PT for evaluation.  7. Hypertension. The patient has not been taking any medications at  home. We will monitor.  8. Metabolic syndrome monitoring. Lipid profile, TSH, hemoglobin A1c and prolactin are pending.  9. Disposition. He will be discharged to home. He will follow up with CBC.  I certify that inpatient services furnished can reasonably be expected to improve the patient's condition.  Kristine LineaJolanta Deboraha Goar, MD 08/29/2015, 11:07 AM

## 2015-08-29 NOTE — BHH Suicide Risk Assessment (Signed)
[facility] INPATIENT:  Family/Significant Other Suicide Prevention Education  Suicide Prevention Education:  Patient Refusal for Family/Significant Other Suicide Prevention Education: The patient Philip LombardQuintin Brege has refused to provide written consent for family/significant other to be provided Family/Significant Other Suicide Prevention Education during admission and/or prior to discharge.  Physician notified.  Pt received SPE from the CSW.   Dorothe PeaJonathan F Memory Heinrichs 08/29/2015, 3:41 PM

## 2015-08-29 NOTE — Progress Notes (Signed)
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[name])  Coping Skills:   Arguments, Substance Abuse, Avoidance, Exercise, Music  Personal Challenges: Anger, Communication, Concentration, Decision-Making, Time Management, Trusting Others  Leisure Interests (2+):  Individual - Other (Comment) (Work, sleep)  Awareness of Community Resources:  No  Community Resources:     Current Use:    If no, Barriers?:    Patient Strengths:  "Me being me", personality  Patient Identified Areas of Improvement:  Control anger  Current Recreation Participation:  Working  Patient Goal for Hospitalization:  Try to control anger  [place]  [place] Residence[place]   Current [place] (including self-harm):  No  Current HI:  No  Consent to Intern Participation: N/A   Jacquelynn CreeGreene,Carmelita Amparo M, LRT/CTRS 08/29/2015, 3:03 PM

## 2015-08-29 NOTE — Progress Notes (Signed)
Patient's Grandmother into visit. At this time patient speaks up to report he would like toiletries and towels to wash up. Patient also reports discomfort to his knee, refusing Tylenol stating it does not work.

## 2015-08-29 NOTE — Progress Notes (Signed)
Recreation Therapy Notes  Date: 08.16.17 Time: 1:00 pm Location: Craft Room  Group Topic: Self-esteem  Goal Area(s) Addresses:  Patient will write at least one positive trait. Patient will verbalize benefit of having a healthy self-esteem.  Behavioral Response: Attentive, Interactive   Intervention: I Am  Activity: Patients were given a worksheet with the letter I on it and instructed to write as many positive traits inside the letter.  Education: LRT educated patients on ways they can increase their self-esteem.  Education Outcome: Acknowledges education/In group clarification offered   Clinical Observations/Feedback: Patient completed activity by writing positive traits about himself. Patient contributed to group discussion by stating what makes it difficult to think about positive traits about self.   Jacquelynn CreeGreene,Benoit Meech M, LRT/CTRS 08/29/2015 2:43 PM

## 2015-08-29 NOTE — Plan of Care (Signed)
Problem: Pain Managment: Goal: General experience of comfort will improve Outcome: Progressing No discomfort at this time.

## 2015-08-29 NOTE — Progress Notes (Signed)
NUTRITION ASSESSMENT  Pt identified as at risk on the Malnutrition Screen Tool  INTERVENTION: Reinforce the importance of nutrition and encourage intake of food and beverages.  NUTRITION DIAGNOSIS: Unintentional weight loss related to sub-optimal intake as evidenced by pt report.   Goal: Pt to meet >/= 90% of their estimated nutrition needs.  Monitor:  PO intake  Assessment:   25 y.o. male admitted with bipolar 1 disorder, most recent episode depressed  Height: Ht Readings from Last 1 Encounters:  08/28/15 6\' 3"  (1.905 m)    Weight: Wt Readings from Last 1 Encounters:  08/28/15 233 lb (105.7 kg)    Weight Hx: Wt Readings from Last 10 Encounters:  08/28/15 233 lb (105.7 kg)  08/27/15 233 lb (105.7 kg)  08/27/15 233 lb (105.7 kg)    BMI:  Body mass index is 29.12 kg/m.  Estimated Nutritional Needs: Kcal: 25-30 kcal/kg Protein: > 1 gram protein/kg Fluid: 1 ml/kcal  Diet Order: Diet regular Room service appropriate? Yes; Fluid consistency: Thin Pt is also offered choice of scheduled unit snacks mid-morning and mid-afternoon.  Pt is eating as desired. Recorded po intake 70-100% of meals  Lab results and medications reviewed.   Romelle Starcherate James Lafalce MS, RD, LDN [phone] Pager  [phone] Weekend/On-Call Pager

## 2015-08-29 NOTE — [facility] Group Notes (Signed)
[facility] LCSW Group Therapy  08/29/2015 3:06 PM  [facility] LCSW Group Therapy Note  Date/Time  Type of Therapy/Topic:  Group Therapy:  Emotion Regulation  Participation Level:  Good Participation   Mood:  Reports Good mood  Description of Group:    The purpose of this group is to assist patients in learning to regulate negative emotions and experience positive emotions. Patients will be guided to discuss ways in which they have been vulnerable to their negative emotions. These vulnerabilities will be juxtaposed with experiences of positive emotions or situations, and patients challenged to use positive emotions to combat negative ones. Special emphasis will be placed on coping with negative emotions in conflict situations, and patients will process healthy conflict resolution skills.  Therapeutic Goals: 1. Patient will identify two positive emotions or experiences to reflect on in order to balance out negative emotions:  2. Patient will label two or more emotions that they find the most difficult to experience:  3. Patient will be able to demonstrate positive conflict resolution skills through discussion or role plays:   Summary of Patient Progress:  Pt able to meet therapeutic goals.  Discussed his anger and ways he has tried to manage it,  as well as, ways he remains optimistic when facing challenges.  Supportive of others in the group.    Therapeutic Modalities:   Cognitive Behavioral Therapy Feelings Identification Dialectical Behavioral Therapy   Philip Norris, MSW, LCSW 08/29/2015, 3:06 PM

## 2015-08-29 NOTE — Plan of Care (Signed)
Problem: Education: Goal: Ability to verbalize precipitating factors for violent behavior will improve Outcome: Not Progressing Pt blames family for any verbal outbursts

## 2015-08-29 NOTE — H&P (Signed)
Psychiatric Admission Assessment Adult  Patient Identification: Philip Norris MRN:  [id number] Date of Evaluation:  08/29/2015 Chief Complaint:  Bipolar Principal Diagnosis: Bipolar I disorder, most recent episode depressed ([place]) Diagnosis:   Patient Active Problem List   Diagnosis Date Noted  . Bipolar I disorder, most recent episode depressed ([place]) [F31.30] 08/28/2015    Priority: High  . Tobacco use disorder [F17.200] 08/29/2015  . Hypertension [I10] 08/28/2015  . Cannabis use disorder, moderate, dependence ([place]) [F12.20] 08/28/2015  . Knee pain [M25.569] 08/28/2015   History of Present Illness:   Identifying data. Philip Norris is a 25 year old male with history of depression and mood instability.  Chief complaint. "I was yelling at my grandmother."  History of present illness. Information was obtained from the patient and the chart. The patient has a long history of depression and mood instability. In the past he had been treated with antidepressants that were not very helpful and therapy. He has not however seen a psychiatrist in several years. He reports some depressive symptoms with poor sleep, increased appetite, anhedonia, social isolation, and crying spells. He also describes episodes of impulsivity, irritability, poor anger control, racing thoughts, and talkativity. He hears voices at times. He described angry rages for which police was called to the house in the past. Yesterday he was arguing with his grandmother who was pushing his buttons and became agitated and started yelling. He denies making any suicidal or homicidal threats, being violent towards people or property. He denies paranoid thinking. He has infrequent panic attacks but denies symptoms of PTSD or OCD. He is a marijuana smoker. He does not use alcohol or other drugs.  On the day of admission at the patient came to the emergency room for the first time complaining of knee pain. X-ray was negative. Apparently  he was given pain medication. It seems that he was arguing with his grandmother about his pain pills.  Past psychiatric history. He was treated at [facility] and [facility] before. He was tried on numerous antidepressants but admits that he was never fully compliant. He felt that there were no benefits from antidepressants. He was in therapy that he found helpful but was frustrated with frequent therapists changes. He denies ever being hospitalized or attempting suicide. He was diagnosed with ADHD as a child.  Family psychiatric history. His great grandmother suffered mental illness.  Social history. He dropped out of high school. He works at [employer] since February 2017. He lives with his grandmother who is a Environmental education officer. The patient has financial but very little emotional support from his family as they do not approve of his sexual orientation. He has not been in a relationship for a while.   Total Time spent with patient: 1 hour  Is the patient at risk to self? No.  Has the patient been a risk to self in the past 6 months? No.  Has the patient been a risk to self within the distant past? No.  Is the patient a risk to others? No.  Has the patient been a risk to others in the past 6 months? No.  Has the patient been a risk to others within the distant past? No.   Prior Inpatient Therapy:   Prior Outpatient Therapy:    Alcohol Screening: 1. How often do you have a drink containing alcohol?: 2 to 4 times a month 2. How many drinks containing alcohol do you have on a typical day when you are drinking?: 1 or 2 3. How  often do you have six or more drinks on one occasion?: Less than monthly Preliminary Score: 1 Brief Intervention: AUDIT score less than 7 or less-screening does not suggest unhealthy drinking-brief intervention not indicated Substance Abuse History in the last 12 months:  Yes.   Consequences of Substance Abuse: Negative Previous Psychotropic Medications: Yes  Psychological  Evaluations: No  Past Medical History:  Past Medical History:  Diagnosis Date  . Anxiety   . Bipolar affect, depressed ([place])   . Depression   . Hypertension    History reviewed. No pertinent surgical history. Family History: History reviewed. No pertinent family history.  Tobacco Screening: Have you used any form of tobacco in the last 30 days? (Cigarettes, Smokeless Tobacco, Cigars, and/or Pipes): Yes Tobacco use, Select all that apply: 4 or less cigarettes per day Are you interested in Tobacco Cessation Medications?: No, patient refused Counseled patient on smoking cessation including recognizing danger situations, developing coping skills and basic information about quitting provided: Yes Social History:  History  Alcohol Use  . Yes     History  Drug use: Unknown    Additional Social History:                           Allergies:  No Known Allergies Lab Results:  Results for orders placed or performed during the hospital encounter of 08/27/15 (from the past 48 hour(s))  Comprehensive metabolic panel     Status: Abnormal   Collection Time: 08/27/15  9:30 PM  Result Value Ref Range   Sodium 139 135 - 145 mmol/L   Potassium 3.8 3.5 - 5.1 mmol/L   Chloride 104 101 - 111 mmol/L   CO2 31 22 - 32 mmol/L   Glucose, Bld 88 65 - 99 mg/dL   BUN 13 6 - 20 mg/dL   Creatinine, Ser 0.89 0.61 - 1.24 mg/dL   Calcium 9.9 8.9 - 10.3 mg/dL   Total Protein 8.6 (H) 6.5 - 8.1 g/dL   Albumin 5.1 (H) 3.5 - 5.0 g/dL   AST 20 15 - 41 U/L   ALT 20 17 - 63 U/L   Alkaline Phosphatase 47 38 - 126 U/L   Total Bilirubin 0.9 0.3 - 1.2 mg/dL   GFR calc non Af Amer >60 >60 mL/min   GFR calc Af Amer >60 >60 mL/min    Comment: (NOTE) The eGFR has been calculated using the CKD EPI equation. This calculation has not been validated in all clinical situations. eGFR's persistently <60 mL/min signify possible Chronic Kidney Disease.    Anion gap 4 (L) 5 - 15  Ethanol     Status: None    Collection Time: 08/27/15  9:30 PM  Result Value Ref Range   Alcohol, Ethyl (B) <5 <5 mg/dL    Comment:        LOWEST DETECTABLE LIMIT FOR SERUM ALCOHOL IS 5 mg/dL FOR MEDICAL PURPOSES ONLY   Salicylate level     Status: None   Collection Time: 08/27/15  9:30 PM  Result Value Ref Range   Salicylate Lvl <6.7 2.8 - 30.0 mg/dL  Acetaminophen level     Status: Abnormal   Collection Time: 08/27/15  9:30 PM  Result Value Ref Range   Acetaminophen (Tylenol), Serum <10 (L) 10 - 30 ug/mL    Comment:        THERAPEUTIC CONCENTRATIONS VARY SIGNIFICANTLY. A RANGE OF 10-30 ug/mL MAY BE AN EFFECTIVE CONCENTRATION FOR MANY PATIENTS. HOWEVER, SOME ARE  BEST TREATED AT CONCENTRATIONS OUTSIDE THIS RANGE. ACETAMINOPHEN CONCENTRATIONS >150 ug/mL AT 4 HOURS AFTER INGESTION AND >50 ug/mL AT 12 HOURS AFTER INGESTION ARE OFTEN ASSOCIATED WITH TOXIC REACTIONS.   cbc     Status: Abnormal   Collection Time: 08/27/15  9:30 PM  Result Value Ref Range   WBC 9.5 3.8 - 10.6 K/uL   RBC 5.18 4.40 - 5.90 MIL/uL   Hemoglobin 14.0 13.0 - 18.0 g/dL   HCT 44.6 40.0 - 52.0 %   MCV 86.3 80.0 - 100.0 fL   MCH 27.0 26.0 - 34.0 pg   MCHC 31.3 (L) 32.0 - 36.0 g/dL   RDW 13.2 11.5 - 14.5 %   Platelets 217 150 - 440 K/uL  Urine Drug Screen, Qualitative     Status: Abnormal   Collection Time: 08/28/15  2:18 PM  Result Value Ref Range   Tricyclic, Ur Screen NONE DETECTED NONE DETECTED   Amphetamines, Ur Screen NONE DETECTED NONE DETECTED   MDMA (Ecstasy)Ur Screen NONE DETECTED NONE DETECTED   Cocaine Metabolite,Ur [place] NONE DETECTED NONE DETECTED   Opiate, Ur Screen NONE DETECTED NONE DETECTED   Phencyclidine (PCP) Ur S NONE DETECTED NONE DETECTED   Cannabinoid 50 Ng, Ur [place] POSITIVE (A) NONE DETECTED   Barbiturates, Ur Screen NONE DETECTED NONE DETECTED   Benzodiazepine, Ur Scrn NONE DETECTED NONE DETECTED   Methadone Scn, Ur NONE DETECTED NONE DETECTED    Comment: (NOTE) 161  Tricyclics, urine                Cutoff 1000 ng/mL 200  Amphetamines, urine             Cutoff 1000 ng/mL 300  MDMA (Ecstasy), urine           Cutoff 500 ng/mL 400  Cocaine Metabolite, urine       Cutoff 300 ng/mL 500  Opiate, urine                   Cutoff 300 ng/mL 600  Phencyclidine (PCP), urine      Cutoff 25 ng/mL 700  Cannabinoid, urine              Cutoff 50 ng/mL 800  Barbiturates, urine             Cutoff 200 ng/mL 900  Benzodiazepine, urine           Cutoff 200 ng/mL 1000 Methadone, urine                Cutoff 300 ng/mL 1100 1200 The urine drug screen provides only a preliminary, unconfirmed 1300 analytical test result and should not be used for non-medical 1400 purposes. Clinical consideration and professional judgment should 1500 be applied to any positive drug screen result due to possible 1600 interfering substances. A more specific alternate chemical method 1700 must be used in order to obtain a confirmed analytical result.  1800 Gas chromato graphy / mass spectrometry (GC/MS) is the preferred 1900 confirmatory method.     Blood Alcohol level:  Lab Results  Component Value Date   ETH <5 09/60/4540    Metabolic Disorder Labs:  No results found for: HGBA1C, MPG No results found for: PROLACTIN No results found for: CHOL, TRIG, HDL, CHOLHDL, VLDL, LDLCALC  Current Medications: Current Facility-Administered Medications  Medication Dose Route Frequency Provider Last Rate Last Dose  . acetaminophen (TYLENOL) tablet 650 mg  650 mg Oral Q6H PRN Gonzella Lex, MD   650 mg at 08/29/15 0052  .  alum & mag hydroxide-simeth (MAALOX/MYLANTA) 200-200-20 MG/5ML suspension 30 mL  30 mL Oral Q4H PRN Gonzella Lex, MD      . magnesium hydroxide (MILK OF MAGNESIA) suspension 30 mL  30 mL Oral Daily PRN Gonzella Lex, MD       PTA Medications: Prescriptions Prior to Admission  Medication Sig Dispense Refill Last Dose  . meloxicam (MOBIC) 15 MG tablet Take 1 tablet (15 mg total) by mouth daily. 30 tablet 2  08/27/2015 at Unknown time    Musculoskeletal: Strength & Muscle Tone: within normal limits Gait & Station: normal Patient leans: N/A  Psychiatric Specialty Exam: Physical Exam  Nursing note and vitals reviewed. Constitutional: He is oriented to person, place, and time. He appears well-developed and well-nourished.  HENT:  Head: Normocephalic and atraumatic.  Eyes: Conjunctivae and EOM are normal. Pupils are equal, round, and reactive to light.  Neck: Normal range of motion. Neck supple.  Cardiovascular: Normal rate, regular rhythm and normal heart sounds.   Respiratory: Effort normal and breath sounds normal.  GI: Soft. Bowel sounds are normal.  Musculoskeletal: Normal range of motion.  Neurological: He is alert and oriented to person, place, and time.  Skin: Skin is warm and dry.    ROS  Blood pressure (!) 152/88, pulse 71, temperature 98 F (36.7 C), temperature source Oral, resp. rate 18, height '6\' 3"'  (1.905 m), weight 105.7 kg (233 lb), SpO2 100 %.Body mass index is 29.12 kg/m.  See SRA.                                                  Sleep:  Number of Hours: 4       Treatment Plan Summary: Daily contact with patient to assess and evaluate symptoms and progress in treatment and Medication management   Mr. Kienast is a 25 year old male with history of depression and what is the ability admitted for agitated, threatening behavior in the context of treatment noncompliance.  1. Agitation. This has resolved.  2. Mood. We will start the patient on Trileptal for mood stabilization and Risperdal for psychosis.  3. Insomnia. We'll offer trazodone.  4. Smoking. Nicotine patch is available.  5. Marijuana use. The patient minimizes his problems and declines treatment.  6. Knee pain. This seems to be a chronic issue. He is neat was x-rayed in the emergency room and results were unremarkable. He is not allowed to use a brace on the unit. We will ask  PT for evaluation.  7. Hypertension. The patient has not been taking any medications at home. We will monitor.  8. Metabolic syndrome monitoring. Lipid profile, TSH, hemoglobin A1c and prolactin are pending.  9. Disposition. He will be discharged to home. He will follow up with CBC.   Observation Level/Precautions:  15 minute checks  Laboratory:  CBC Chemistry Profile UDS UA  Psychotherapy:    Medications:    Consultations:    Discharge Concerns:    Estimated LOS:  Other:     I certify that inpatient services furnished can reasonably be expected to improve the patient's condition.    Orson Slick, MD 8/16/201711:14 AM

## 2015-08-29 NOTE — [facility] Counselor (Signed)
Adult Comprehensive Assessment  Patient ID: Philip Norris, male   DOB: [birth year], 25 y.o.   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 Social relationships: Pt denies Substance abuse: Pt denies Bereavement / Loss: Pt denies  Living/Environment/Situation:  Living Arrangements: Other relatives Database administrator(Grandmother) Living conditions (as described by patient or guardian): Pt lives with his grandmother How long has patient lived in current situation?: 3-4 years What is atmosphere in current home: Comfortable, ParamedicLoving, Supportive  Family History:  Marital status: Single Does patient have children?: No  Childhood History:  By whom was/is the patient raised?: Mother (Mother and grandmother) Additional childhood history information: Pt reports a normal relationship Description of patient's relationship with caregiver when they were a child: "Just alright, not the best" Patient's description of current relationship with people who raised him/her: Pt reports relationships with both, "Still ain't the best". Does patient have siblings?: Yes Number of Siblings: 1 Description of patient's current relationship with siblings: Pt reports "It's good". Did patient suffer any verbal/emotional/physical/sexual abuse as a child?: No Did patient suffer from severe childhood neglect?: No Has patient ever been sexually abused/assaulted/raped as an adolescent or adult?: No Was the patient ever a victim of  a crime or a disaster?: No Witnessed domestic violence?: No Has patient been effected by domestic violence as an adult?: No  Education:  Highest grade of school patient has completed: 9th grade Learning disability?: Yes (ADD diagnosis)  Employment/Work Situation:   Employment situation: Employed Where is patient currently employed?: [name] [place] How long has patient been employed?: Pt doesn't think so. 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 Has patient ever been in the [employer] and Companymilitary?: No  Financial Resources:   Financial resources: Income from employment Does patient have a representative payee or guardian?: No  Alcohol/Substance Abuse:   What has been your use of drugs/alcohol within the last 12 months?: Pt reports he drinks alcohol three or four times a month in the amount of five shots of liquor and endorses the use of marijuana on a daily basis in the amount of ten blunts a day If attempted suicide, did drugs/alcohol play a role in this?: No Alcohol/Substance Abuse Treatment Hx: Denies past history Has alcohol/substance abuse ever caused legal problems?: No  Social Support System:   Conservation officer, natureatient's Community Support System: Fair Describe Community Support System: Pt reports his grandmother Type of faith/religion: Ephriam KnucklesChristian How does patient's faith help to cope with current illness?: Prayer  Leisure/Recreation:   Leisure and Hobbies: Pt reports he just works and sleeps  Strengths/Needs:   What things does the patient do well?: Working outside [employer] flowers In what areas does patient struggle / problems for patient: Concentrating for long periods  Discharge Plan:   Does patient have access to transportation?: Yes (Pt's grandmother) Will patient be returning to same living situation after discharge?: Yes Currently receiving community mental health services:  ([facility]) If  no, would patient like  referral for services when discharged?:  ([facility] from [facility]) Does patient have financial barriers related to discharge medications?: No  Summary/Recommendations:   Summary and Recommendations (to be completed by the evaluator): Patient presented to the hospital under IVC and was admitted after an argument with his grandmother which resulted in the police being called.  Pt's primary diagnosis is Bipolar I disorder, most recent episode depressed ([facility]).  Pt reports primary triggers for admission was conflict with the pt's mother over the pt's medication.  Pt reports his stressors are nonexistent after the pt was recently employed.  Pt now denies SI/HI/AVH.  [place], [place]  Pt lists his sole support in the community as his grandmother.  Patient will benefit from crisis stabilization, medication evaluation, group therapy, and psycho education in addition to case management for discharge planning. Patient and CSW reviewed pt's identified goals and treatment plan. Pt verbalized understanding and agreed to treatment plan.  At discharge it is recommended that patient remain compliant with established plan and continue treatment.  Philip PeaJonathan F Pavneet Norris. 08/29/2015

## 2015-08-29 NOTE — Progress Notes (Signed)
D: Pt received from [facility] ED. Pt came with brace to right knee. Pt had no skin breakdown or contraband Patient alert and oriented x4. Patient denies SI/HI/AVH. Pt affect is blunted. Pt blames his grandmother for being here stating "we got into a big argument...she thought I was going to take a whole bottle of pain medicine." Pt endorsed conflict with family as his main stressor. Pt indicated he gets into arguments with his family regularly, but indicated it's their fault. Pt c/o of right knee pain, and pt stated he has had arthritis for 5 years. A: Educated pt on unit policy. Oriented pt to unit. Reviewed admission material with pt. Called doctor about knee brace, doctor advised to remove brace for the time being. Writer removed brace.  Offered active listening and support. Provided therapeutic communication. Gave tylenol prn for pain R: Pt pleasant and cooperative. Will continue Q15 min. checks. Safety maintained.

## 2015-08-29 NOTE — Progress Notes (Addendum)
Patient with sad affect, cooperative behavior with meals, meds and plan of care. No SI/HI at this time. Patient independent and using wheelchair rt history of knee pain. Patient denies discomfort at this time, states he is "afraid to use his knee". Therapy groups encouraged, safety maintained. Daily audit sheet complete with watching temper as his goal. PT comes to visit with patient in group. Pt reports they will re visit later in the day. Attends therapy groups.

## 2015-08-29 NOTE — Progress Notes (Signed)
PT Cancellation Note  Patient Details Name: Philip LombardQuintin Norris MRN: [id number] DOB: 12/28/1990   Cancelled Treatment:    Reason Eval/Treat Not Completed: Patient at procedure or test/unavailable (in group session, nsg asked to wait).  Can try later as time and pt allow.   Ivar DrapeStout, Avon Molock E 08/29/2015, 2:11 PM    Samul Dadauth Rooney Swails, PT MS Acute Rehab Dept. Number: [facility] [phone] and [facility] [phone]

## 2015-08-29 NOTE — Tx Team (Signed)
Initial Interdisciplinary Treatment Plan   PATIENT STRESSORS: Marital or family conflict   PATIENT STRENGTHS: Active sense of humor Average or above average intelligence Capable of independent living   PROBLEM LIST: Problem List/Patient Goals Date to be addressed Date deferred Reason deferred Estimated date of resolution  Hostile and verbally abusive towards grandmother      Bipolar      "Have a family meeting."                                           DISCHARGE CRITERIA:  Improved stabilization in mood, thinking, and/or behavior  PRELIMINARY DISCHARGE PLAN: Outpatient therapy Participate in family therapy  PATIENT/FAMIILY INVOLVEMENT: This treatment plan has been presented to and reviewed with the patient, Margaretmary LombardQuintin Espinal.  The patient and family have been given the opportunity to ask questions and make suggestions.  Jonette MateLuke B Samuele Storey 08/29/2015, 5:29 AM

## 2015-08-30 LAB — LIPID PANEL
Cholesterol: 135 mg/dL (ref 0–200)
HDL: 43 mg/dL (ref >40)
LDL CALC: 80 mg/dL (ref 0–99)
TRIGLYCERIDES: 62 mg/dL (ref <150)
Total CHOL/HDL Ratio: 3.1 RATIO
VLDL: 12 mg/dL (ref 0–40)

## 2015-08-30 LAB — HEMOGLOBIN A1C: Hgb A1c MFr Bld: 4.9 % (ref 4.0–6.0)

## 2015-08-30 LAB — TSH: TSH: 1.722 u[IU]/mL (ref 0.350–4.500)

## 2015-08-30 MED ORDER — TRAZODONE HCL 100 MG PO TABS: 100.0000 mg | ORAL_TABLET | Freq: Every day | ORAL | 1 refills | Status: AC

## 2015-08-30 MED ORDER — TRAZODONE HCL 100 MG PO TABS: 100.0000 mg | ORAL_TABLET | Freq: Every day | ORAL | 1 refills | Status: DC

## 2015-08-30 MED ORDER — OXCARBAZEPINE 300 MG PO TABS
300.0000 mg | ORAL_TABLET | Freq: Two times a day (BID) | ORAL | Status: DC
  Administered 2015-08-30: 300 mg via ORAL
  Filled 2015-08-30: qty 1

## 2015-08-30 MED ORDER — OXCARBAZEPINE 300 MG PO TABS: 300.0000 mg | ORAL_TABLET | Freq: Two times a day (BID) | ORAL | 1 refills | Status: AC

## 2015-08-30 MED ORDER — RISPERIDONE 2 MG PO TABS: 2.0000 mg | ORAL_TABLET | Freq: Two times a day (BID) | ORAL | 1 refills | Status: AC

## 2015-08-30 MED ORDER — HYDROCHLOROTHIAZIDE 25 MG PO TABS: 25.0000 mg | ORAL_TABLET | Freq: Every day | ORAL | 1 refills | Status: AC

## 2015-08-30 MED ORDER — RISPERIDONE 2 MG PO TABS: 2.0000 mg | ORAL_TABLET | Freq: Two times a day (BID) | ORAL | 1 refills | Status: DC

## 2015-08-30 MED ORDER — HYDROCHLOROTHIAZIDE 25 MG PO TABS: 25.0000 mg | ORAL_TABLET | Freq: Every day | ORAL | 1 refills | Status: DC

## 2015-08-30 MED ORDER — OXCARBAZEPINE 300 MG PO TABS: 300.0000 mg | ORAL_TABLET | Freq: Two times a day (BID) | ORAL | 1 refills | Status: DC

## 2015-08-30 MED ORDER — HYDROCHLOROTHIAZIDE 25 MG PO TABS
25.0000 mg | ORAL_TABLET | Freq: Every day | ORAL | Status: DC
  Administered 2015-08-30: 25 mg via ORAL
  Filled 2015-08-30: qty 1

## 2015-08-30 NOTE — Tx Team (Addendum)
Interdisciplinary Treatment Plan Update (Adult)        Date: 08/30/2015   Time Reviewed: 9:30 AM   Progress in Treatment: Improving  Attending groups: Continuing to assess, patient new to milieu  Participating in groups: Continuing to assess, patient new to milieu  Taking medication as prescribed: Yes  Tolerating medication: Yes  Family/Significant other contact made: Yes, CSw spoke to the pt's grandmother Patient understands diagnosis: Yes  Discussing patient identified problems/goals with staff: Yes  Medical problems stabilized or resolved: Yes  Denies suicidal/homicidal ideation: Yes  Issues/concerns per patient self-inventory: Yes  Other:   New problem(s) identified: N/A   Discharge Plan or Barriers:  Pt will discharge home to [place] to live with his grandmother and will follow up with [facility] for medication management, substance abuse treatment and therapy.  Reason for Continuation of Hospitalization:   Depression   Anxiety   Medication Stabilization   Comments: N/A   Estimated date of discharge: 08/30/15   Patient is a 25 year old male with history of depression and mood instability.  Chief complaint. "I was yelling at my grandmother."  History of present illness. Information was obtained from the patient and the chart. The patient has a long history of depression and mood instability. In the past he had been treated with antidepressants that were not very helpful and therapy. He has not however seen a psychiatrist in several years. He reports some depressive symptoms with poor sleep, increased appetite, anhedonia, social isolation, and crying spells. He also describes episodes of impulsivity, irritability, poor anger control, racing thoughts, and talkativity. He hears voices at times. He described angry rages for which police was called to the house in the past. Yesterday he was arguing with his grandmother who was pushing his buttons and became agitated and started  yelling. He denies making any suicidal or homicidal threats, being violent towards people or property. He denies paranoid thinking. He has infrequent panic attacks but denies symptoms of PTSD or OCD. He is a marijuana smoker. He does not use alcohol or other drugs.  On the day of admission at the patient came to the emergency room for the first time complaining of knee pain. X-ray was negative. Apparently he was given pain medication. It seems that he was arguing with his grandmother about his pain pills.  Past psychiatric history. He was treated at [facility] and [facility] before. He was tried on numerous antidepressants but admits that he was never fully compliant. He felt that there were no benefits from antidepressants. He was in therapy that he found helpful but was frustrated with frequent therapists changes. He denies ever being hospitalized or attempting suicide. He was diagnosed with ADHD as a child.  Family psychiatric history. His great grandmother suffered mental illness.  Social history. He dropped out of high school. He works at [employer] since February 2017. He lives with his grandmother who is a Environmental education officer. The patient has financial but very Patient lives in [place]. Patient will benefit from crisis stabilization, medication evaluation, group therapy, and psycho education in addition to case management for discharge planning. Patient and CSW reviewed pt's identified goals and treatment plan. Pt verbalized understanding and agreed to treatment plan.    Review of initial/current patient goals per problem list:  1. Goal(s): Patient will participate in aftercare plan   Met: Yes  Target date: 3-5 days post admission date   As evidenced by: Patient will participate within aftercare plan AEB aftercare provider and housing plan at discharge  being identified.   8/17: Pt will discharge home to [facility] to live with his grandmother and will follow up with [facility] for medication management,  substance abuse treatment and therapy.    2. Goal (s): Patient will exhibit decreased depressive symptoms and suicidal ideations.   Met: Adequate for discharge per MD.  Target date: 3-5 days post admission date   As evidenced by: Patient will utilize self-rating of depression at 3 or below and demonstrate decreased signs of depression or be deemed stable for discharge by MD.   8/17: Adequate for discharge per MD.  Pt denies SI/HI.  Pt reports he is safe for discharge.   3. Goal(s): Patient will demonstrate decreased signs and symptoms of anxiety.   Met: Adequate for discharge per MD.  Target date: 3-5 days post admission date   As evidenced by: Patient will utilize self-rating of anxiety at 3 or below and demonstrated decreased signs of anxiety, or be deemed stable for discharge by MD   8/17: Adequate for discharge per MD.  Pt reports baseline symptoms of anxiety   4. Goal(s): Patient will demonstrate decreased signs of withdrawal due to substance abuse   Met: Adequate for discharge per MD.  Target date: 3-5 days post admission date   As evidenced by: Patient will produce a CIWA/COWS score of 0, have stable vitals signs, and no symptoms of withdrawal    8/17: Adequate for discharge per MD.  5. Goal(s): Patient will demonstrate decreased signs of psychosis  * Met: Adequate for discharge per MD. * Target date: 3-5 days post admission date  * As evidenced by: Patient will demonstrate decreased frequency of AVH or return to baseline function   8/17: Adequate for discharge per MD. Pt denies [place]  Attendees:  Patient: Philip Norris Family:  Physician: Bary Leriche, MD     08/30/2015 9:30 AM  Nursing: Floyde Parkins, RN     08/30/2015 9:30 AM  Clinical Social Worker: Marylou Flesher, [place]  08/30/2015 9:30 AM  Recreational Therapist: Everitt Amber, LRT  08/30/2015 9:30 AM  Other: , NP       08/30/2015 9:30 AM  Other:        08/30/2015 9:30 AM  Other:        08/30/2015 9:30 AM    Alphonse Guild. Almarie Kurdziel, LCSWA, LCAS

## 2015-08-30 NOTE — Progress Notes (Signed)
Discharge note:  Patient discharged home per MD order.  Patient received all personal belongings from unit and locker.  Reviewed discharge instructions and follow up appointments with patient and he indicated understanding.  Patient denies any SI/HI/AVH.  Patient left with prescriptions and medication samples.  Patient left ambulatory with his grandmother.

## 2015-08-30 NOTE — B[facility] Group Notes (Signed)
[facility] LCSW Group Therapy Note  Type of Therapy and Topic:  Group Therapy:  Goals Group: SMART Goals  Participation Level:  Patient attended group on this date. Patient participated in goal setting and was able to share openly with the group.   Description of Group:    The purpose of a daily goals group is to assist and guide patients in setting recovery/wellness-related goals.  The objective is to set goals as they relate to the crisis in which they were admitted. Patients will be using SMART goal modalities to set measurable goals.  Characteristics of realistic goals will be discussed and patients will be assisted in setting and processing how one will reach their goal. Facilitator will also assist patients in applying interventions and coping skills learned in psycho-education groups to the SMART goal and process how one will achieve defined goal.  Therapeutic Goals: -Patients will develop and document one goal related to or their crisis in which brought them into treatment. -Patients will be guided by LCSW using SMART goal setting modality in how to set a measurable, attainable, realistic and time sensitive goal.  -Patients will process barriers in reaching goal. -Patients will process interventions in how to overcome and successful in reaching goal.   Summary of Patient Progress:  Patient Goal:  Patient stated his goal was to work on anger management and healthier coping skills to deal with his anger.     Therapeutic Modalities:   Motivational Interviewing  Engineer, manufacturing systemsCognitive Behavioral Therapy Crisis Intervention Model SMART goals setting  Megon Kalina G. Garnette CzechSampson MSW, [facility] CenterCSWA 08/30/2015 11:48 AM

## 2015-08-30 NOTE — Progress Notes (Signed)
  [facility] :  Will you be returning to the same living situation after discharge:  Yes,  pt will be returning home to [place] to live with his grandmother At discharge, do you have transportation home?: Yes,  pt will be picked up by his grandmother Do you have the ability to pay for your medications: Yes,  pt will be provided with medications at discharge  Release of information consent forms completed and in the chart;  Patient's signature needed at discharge.  Patient to Follow up at: Follow-up Information    [facility] .   Why:  Please arrive to the walk-in clinic between the hours of 8am-2:30pm for an assessment for medication management and therapy.  Arrive as early as possible for prompt service.  Please call Unk PintoHarvey Bryant at [phone] for questions and assistance.  Contact information: [address] [place] [address] [phone]           Next level of care provider has access to [facility] Link:no  Safety Planning and Suicide Prevention discussed: Yes,  completed with pt  Have you used any form of tobacco in the last 30 days? (Cigarettes, Smokeless Tobacco, Cigars, and/or Pipes): Yes  Has patient been referred to the Quitline?: Patient refused referral  Patient has been referred for addiction treatment: Yes  Dorothe PeaJonathan F Jinna Weinman 08/30/2015, 4:21 PM

## 2015-08-30 NOTE — [facility] Suicide Risk Assessment (Signed)
[facility]   Principal Problem: Bipolar I disorder, most recent episode depressed [facility](HCC) Discharge Diagnoses:  Patient Active Problem List   Diagnosis Date Noted  . Bipolar I disorder, most recent episode depressed (HCC) [F31.30] 08/28/2015    Priority: High  . Tobacco use disorder [F17.200] 08/29/2015  . Hypertension [I10] 08/28/2015  . Cannabis use disorder, moderate, dependence (HCC) [F12.20] 08/28/2015  . Knee pain [M25.569] 08/28/2015    Total Time spent with patient: 30 minutes  Musculoskeletal: Strength & Muscle Tone: within normal limits Gait & Station: normal Patient leans: N/A  Psychiatric Specialty Exam: Review of Systems  All other systems reviewed and are negative.   Blood pressure (!) 146/82, pulse 80, temperature 98.2 F (36.8 C), resp. rate 18, height 6\' 3"  (1.905 m), weight 105.7 kg (233 lb), SpO2 100 %.Body mass index is 29.12 kg/m.  General Appearance: Casual  Eye Contact::  Good  Speech:  Clear and Coherent409  Volume:  Normal  Mood:  Euthymic  Affect:  Appropriate  Thought Process:  Goal Directed  Orientation:  Full (Time, Place, and Person)  Thought Content:  WDL  Suicidal Thoughts:  No  Homicidal Thoughts:  No  Memory:  Immediate;   Fair Recent;   Fair Remote;   Fair  Judgement:  Fair  Insight:  Present  Psychomotor Activity:  Normal  Concentration:  Fair  Recall:  FiservFair  Fund of Knowledge:Fair  Language: Fair  Akathisia:  No  Handed:  Right  AIMS (if indicated):     Assets:  Communication Skills Desire for Improvement Financial Resources/Insurance Housing Physical Health Resilience Social Support Transportation  Sleep:  Number of Hours: 5.5  Cognition: WNL  ADL's:  Intact   Mental Status Per Nursing Assessment::   On Admission:     Demographic Factors:  Male  Loss Factors: NA  Historical Factors: Impulsivity  Risk Reduction Factors:   Sense of responsibility to family, Employed, Living with  another person, especially a relative and Positive social support  Continued Clinical Symptoms:  Bipolar Disorder:   Mixed State Alcohol/Substance Abuse/Dependencies  Cognitive Features That Contribute To Risk:  None    Suicide Risk:  Minimal: No identifiable suicidal ideation.  Patients presenting with no risk factors but with morbid ruminations; may be classified as minimal risk based on the severity of the depressive symptoms  Follow-up Information    [facility] .   Why:  Please arrive to the walk-in clinic between the hours of 8am-2:30pm for an assessment for medication management and therapy.  Arrive as early as possible for prompt service.  Please call Unk PintoHarvey Bryant at [phone] for questions and assistance.  Contact information: [address] [place] [address] [phone]           Plan Of Care/Follow-up recommendations:  Activity:  as tolerated. Diet:  regular. Other:  keep follow up appointments.  Kristine LineaJolanta Cataleya Cristina, MD 08/30/2015, 11:44 AM

## 2015-08-30 NOTE — Progress Notes (Signed)
D: Pt denies SI/HI/AVH. Pt is pleasant and cooperative, affect is flat, but brightens upon approach, no acute distress noted. Pt apears less anxious and he is interacting with peers and staff appropriately.  A: Pt was offered support and encouragement. Pt was given scheduled medications. Pt was encouraged to attend groups. Q 15 minute checks were done for safety.  R:Pt attends groups and interacts well with peers and staff. Pt is taking medication. Pt has no complaints.Pt receptive to treatment and safety maintained on unit.

## 2015-08-30 NOTE — Progress Notes (Signed)
Recreation Therapy Notes  INPATIENT RECREATION TR PLAN  Patient Details Name: Currie Dennin MRN: [id number] DOB: Dec 04, 1990 Today's Date: 08/30/2015  Rec Therapy Plan Is patient appropriate for Therapeutic Recreation?: Yes Treatment times per week: At least once a week TR Treatment/Interventions: 1:1 session, Group participation (Comment) (Appropriate participation in daily recreational therapy tx)  Discharge Criteria Pt will be discharged from therapy if:: Treatment goals are met, Discharged Treatment plan/goals/alternatives discussed and agreed upon by:: Patient/family  Discharge Summary Short term goals set: See Care Plan Short term goals met: Complete Progress toward goals comments: One-to-one attended Which groups?: Self-esteem, Leisure education One-to-one attended: Anger management, decision making Reason goals not met: N/A Therapeutic equipment acquired: None Reason patient discharged from therapy: Discharge from hospital Pt/family agrees with progress & goals achieved: Yes Date patient discharged from therapy: 08/30/15   Leonette Monarch, LRT/CTRS 08/30/2015, 3:25 PM

## 2015-08-30 NOTE — Discharge Summary (Signed)
Physician Discharge Summary Note  Patient:  Philip Norris is an 25 y.o., male MRN:  [id number] DOB:  1990-12-11 Patient phone:  [phone] (home)  Patient address:   [address] [place] [address],  Total Time spent with patient: 30 minutes  Date of Admission:  08/28/2015 Date of Discharge: 08/30/2015  Reason for Admission:  Agitation.  Identifying data. Philip Norris is a 25 year old male with history of depression and mood instability.  Chief complaint. "I was yelling at my grandmother."  History of present illness. Information was obtained from the patient and the chart. The patient has a long history of depression and mood instability. In the past he had been treated with antidepressants that were not very helpful and therapy. He has not however seen a psychiatrist in several years. He reports some depressive symptoms with poor sleep, increased appetite, anhedonia, social isolation, and crying spells. He also describes episodes of impulsivity, irritability, poor anger control, racing thoughts, and talkativity. He hears voices at times. He described angry rages for which police was called to the house in the past. Yesterday he was arguing with his grandmother who was pushing his buttons and became agitated and started yelling. He denies making any suicidal or homicidal threats, being violent towards people or property. He denies paranoid thinking. He has infrequent panic attacks but denies symptoms of PTSD or OCD. He is a marijuana smoker. He does not use alcohol or other drugs.  On the day of admission at the patient came to the emergency room for the first time complaining of knee pain. X-ray was negative. Apparently he was given pain medication. It seems that he was arguing with his grandmother about his pain pills.  Past psychiatric history. [facility] before. He was tried on numerous antidepressants but admits that he was never fully compliant. He  felt that there were no benefits from antidepressants. He was in therapy that he found helpful but was frustrated with frequent therapists changes. He denies ever being hospitalized or attempting suicide. He was diagnosed with ADHD as a child.  Family psychiatric history. His great grandmother suffered mental illness.  Social history. He dropped out of high school. He works at [name] since February 2017. He lives with his grandmother who is a Programmer, multimediapreacher. The patient has financial but very little emotional support from his family as they do not approve of his sexual orientation. He has not been in a relationship for a while.   Principal Problem: Bipolar I disorder, most recent episode depressed [facility](HCC) Discharge Diagnoses: Patient Active Problem List   Diagnosis Date Noted  . Bipolar I disorder, most recent episode depressed (HCC) [F31.30] 08/28/2015    Priority: High  . Tobacco use disorder [F17.200] 08/29/2015  . Hypertension [I10] 08/28/2015  . Cannabis use disorder, moderate, dependence (HCC) [F12.20] 08/28/2015  . Knee pain [M25.569] 08/28/2015   Past Medical History:  Past Medical History:  Diagnosis Date  . Anxiety   . Bipolar affect, depressed (HCC)   . Depression   . Hypertension    History reviewed. No pertinent surgical history. Family History: History reviewed. No pertinent family history.  Social History:  History  Alcohol Use  . Yes     History  Drug use: Unknown    Social History   Social History  . Marital status: Single    Spouse name: N/A  . Number of children: N/A  . Years of education: N/A   Social History Main Topics  .  Smoking status: Current Every Day Smoker  . Smokeless tobacco: Never Used  . Alcohol use Yes  . Drug use: Unknown  . Sexual activity: Not Asked   Other Topics Concern  . None   Social History Narrative  . None    Hospital Course:    Mr. Dann is a 25 year old male with history of depression and what is the  ability admitted for agitated, threatening behavior in the context of treatment noncompliance.  1. Agitation. This has resolved.  2. Mood. We started Trileptal for mood stabilization and Risperdal for psychosis.  3. Insomnia. He responded well to Trazodone.  4. Smoking. Nicotine patch is available.  5. Marijuana use. The patient minimizes his problems and declines treatment.  6. Knee pain. X-ray in the emergency room was negative. He was prescribed Mobic. PT evaluation pending.  7. Hypertension. We started HCTZ.  8. Metabolic syndrome monitoring. Lipid profile and TSH are normal. Hemoglobin A1c and prolactin are pending.  9. Disposition. He was discharged to home with his grandmother. He will follow up with [facility].  Physical Findings: AIMS:  , ,  ,  ,    CIWA:    COWS:     Musculoskeletal: Strength & Muscle Tone: within normal limits Gait & Station: normal Patient leans: N/A  Psychiatric Specialty Exam: Physical Exam  Nursing note and vitals reviewed.   Review of Systems  Musculoskeletal: Positive for joint pain.  All other systems reviewed and are negative.   Blood pressure (!) 146/82, pulse 80, temperature 98.2 F (36.8 C), resp. rate 18, height 6\' 3"  (1.905 m), weight 105.7 kg (233 lb), SpO2 100 %.Body mass index is 29.12 kg/m.  See SRA.                                                  Sleep:  Number of Hours: 5.5     Have you used any form of tobacco in the last 30 days? (Cigarettes, Smokeless Tobacco, Cigars, and/or Pipes): Yes  Has this patient used any form of tobacco in the last 30 days? (Cigarettes, Smokeless Tobacco, Cigars, and/or Pipes) Yes, Yes, A prescription for an FDA-approved tobacco cessation medication was offered at discharge and the patient refused  Blood Alcohol level:  Lab Results  Component Value Date   ETH <5 08/27/2015    Metabolic Disorder Labs:  No results found for: HGBA1C, MPG No results found for:  PROLACTIN Lab Results  Component Value Date   CHOL 135 08/30/2015   TRIG 62 08/30/2015   HDL 43 08/30/2015   CHOLHDL 3.1 08/30/2015   VLDL 12 08/30/2015   LDLCALC 80 08/30/2015    See Psychiatric Specialty Exam and Suicide Risk Assessment completed by Attending Physician prior to discharge.  Discharge destination:  Home  Is patient on multiple antipsychotic therapies at discharge:  No   Has Patient had three or more failed trials of antipsychotic monotherapy by history:  No  Recommended Plan for Multiple Antipsychotic Therapies: NA  Discharge Instructions    Diet - low sodium heart healthy    Complete by:  As directed   Increase activity slowly    Complete by:  As directed       Medication List    TAKE these medications     Indication  hydrochlorothiazide 25 MG tablet Commonly known as:  HYDRODIURIL Take 1 tablet (  25 mg total) by mouth daily.  Indication:  High Blood Pressure Disorder   meloxicam 15 MG tablet Commonly known as:  MOBIC Take 1 tablet (15 mg total) by mouth daily.  Indication:  Joint Damage causing Pain and Loss of Function   Oxcarbazepine 300 MG tablet Commonly known as:  TRILEPTAL Take 1 tablet (300 mg total) by mouth 2 (two) times daily.  Indication:  Manic-Depression   risperiDONE 2 MG tablet Commonly known as:  RISPERDAL Take 1 tablet (2 mg total) by mouth 2 (two) times daily.  Indication:  Manic-Depression   traZODone 100 MG tablet Commonly known as:  DESYREL Take 1 tablet (100 mg total) by mouth at bedtime.  Indication:  Trouble Sleeping      Follow-up Information    [facility] .   Why:  Please arrive to the walk-in clinic between the hours of 8am-2:30pm for an assessment for medication management and therapy.  Arrive as early as possible for prompt service.  Please call Unk PintoHarvey Bryant at [phone] for questions and assistance.  Contact information: [address] [place] [address] [phone]            Follow-up recommendations:  Activity:  as tolerated. Diet:  regular. Other:  keep follow up appointments.  Comments:    Signed: Kristine LineaJolanta Rickita Forstner, MD 08/30/2015, 12:01 PM

## 2015-08-30 NOTE — Plan of Care (Signed)
Problem: [facility] Participation in Recreation Therapeutic Interventions Goal: STG-Patient will identify at least five coping skills for ** STG: Coping Skills - Within 3 treatment sessions, patient will verbalize at least 5 coping skills for anger in one treatment session to increase anger management skills post d/c.  Outcome: Completed/Met Date Met: 08/30/15 Treatment Session 1; Completed 1 out of 1: At approximately 11:40 am, LRT met with patient in craft room. Patient verbalized 5 coping skills for anger. Patient verbalized what triggers him to get angry, how his body responds to anger, and how he is going to remind himself to use his healthy coping skill. LRT provided suggestions as well.  Leonette Monarch, LRT/CTRS 08.17.17 12:07 pm Goal: STG-Other Recreation Therapy Goal (Specify) STG: Decision Making - Within 3 treatment sessions, patient will verbalize understanding of the decision making charts in one treatment session to increase good decision making skills post d/c.  Outcome: Completed/Met Date Met: 08/30/15 Treatment Session 1; Completed 1 out of 1: At approximately 11:40 am, LRT met with patient in craft room. LRT educated and provided patient with decision making charts. Patient verbalized understanding. LRT encouraged patient to use the decision making charts to help him make better decisions.  Leonette Monarch, LRT/CTRS 8.17.17 12:09 pm

## 2015-08-30 NOTE — Plan of Care (Signed)
Problem: Coping: Goal: Ability to verbalize frustrations and anger appropriately will improve Outcome: Progressing Patient verbalize frustration to staff.    

## 2015-08-30 NOTE — Progress Notes (Signed)
Recreation Therapy Notes  Date: 08.17.17 Time: 1:00 pm Location: Craft Room  Group Topic: Leisure Education  Goal Area(s) Addresses:  Patient will identify activities for each letter of the alphabet.  Patient will verbalize ability to integrate positive leisure into life post d/c. Patient will verbalize ability to use leisure as a Associate Professorcoping skill.  Behavioral Response: Attentive, Interactive  Intervention: Leisure Alphabet  Activity: Patients were given a Leisure Information systems managerAlphabet worksheet and were instructed to write healthy leisure activities for each letter of the alphabet.  Education: LRT educated patients on what they need to participate in leisure activities.  Education Outcome: Acknowledges education/In group clarification offered  Clinical Observations/Feedback: Patient completed activity by writing healthy leisure activities. Patient contributed to group discussion by stating healthy leisure activities.  Jacquelynn CreeGreene,Ernestine Rohman M, LRT/CTRS 08/30/2015 3:23 PM

## 2015-08-30 NOTE — [facility] Group Notes (Signed)
[facility] LCSW Group Therapy  08/30/2015 11:49 AM  Type of Therapy:  Group Therapy  Participation Level:  Minimal  Participation Quality:  Drowsy  Affect:  Lethargic  Cognitive:  Appropriate  Insight:  Limited  Engagement in Therapy:  Improving  Modes of Intervention:  Discussion, Education and Support  Summary of Progress/Problems:Balance in life: Patients will discuss the concept of balance and how it looks and feels to be unbalanced. Pt will identify areas in their life that is unbalanced and ways to become more balanced. Pt minimally engaged but was open to sharing when prompted by the CSW. Pt stated in order to stay balanced in life he needs to make more time to sleep each day.    Philip Norris MSW, LCSWA 08/30/2015, 11:49 AM

## 2015-08-30 NOTE — Plan of Care (Signed)
Problem: Safety: Goal: Ability to remain free from injury will improve Outcome: Progressing Patient denies any thoughts of self harm.  He remains safe on the unit.

## 2015-08-31 LAB — PROLACTIN: PROLACTIN: 37.6 ng/mL — AB (ref 4.0–15.2)

## 2015-09-21 ENCOUNTER — Encounter: Payer: Self-pay | Admitting: Emergency Medicine

## 2015-09-21 ENCOUNTER — Emergency Department
Admission: EM | Admit: 2015-09-21 | Discharge: 2015-09-21 | Disposition: A | Discharge status: Home / Self Care | Payer: Self-pay | Attending: Emergency Medicine | Admitting: Emergency Medicine

## 2015-09-21 DIAGNOSIS — Z79899 Other long term (current) drug therapy: Secondary | ICD-10-CM | POA: Insufficient documentation

## 2015-09-21 DIAGNOSIS — F17200 Nicotine dependence, unspecified, uncomplicated: Secondary | ICD-10-CM | POA: Insufficient documentation

## 2015-09-21 DIAGNOSIS — M25561 Pain in right knee: Secondary | ICD-10-CM | POA: Insufficient documentation

## 2015-09-21 DIAGNOSIS — G8929 Other chronic pain: Secondary | ICD-10-CM | POA: Insufficient documentation

## 2015-09-21 DIAGNOSIS — I10 Essential (primary) hypertension: Secondary | ICD-10-CM | POA: Insufficient documentation

## 2015-09-21 MED ORDER — HYDROCODONE-ACETAMINOPHEN 5-325 MG PO TABS
1.0000 | ORAL_TABLET | Freq: Once | ORAL | Status: AC
  Administered 2015-09-21: 1 via ORAL
  Filled 2015-09-21: qty 1

## 2015-09-21 NOTE — ED Triage Notes (Signed)
Pt here for recurrent knee pain.  Was seen here 3 weeks ago for the same.Did not follow up with orthopedic.  Also reports dome back pain.

## 2015-09-21 NOTE — ED Provider Notes (Signed)
[facility] Emergency Department Provider Note   ____________________________________________   First MD Initiated Contact with Patient 09/21/15 1030     (approximate)  I have reviewed the triage vital signs and the nursing notes.   HISTORY  Chief Complaint Knee Pain   HPI Philip Norris is a 25 y.o. male with complaint of right knee pain. Patient stated in the emergency room for the same 3 weeks ago. He denies any injury to his knee since his last visit to the emergency room. Patient states that he took meloxicam infrequently and still has at least 3 weeks of medication in the bottle. Mother states that she was unaware that he was not taking his medication on a regular basis. She states that shortly after his visit with his knee complaints that he was seen in the emergency room and treated for his "emotional problems". Patient did not make an appointment with the orthopedist as discussed previously and currently is not wearing his knee immobilizer.   Past Medical History:  Diagnosis Date  . Anxiety   . Bipolar affect, depressed (HCC)   . Depression   . Hypertension     Patient Active Problem List   Diagnosis Date Noted  . Tobacco use disorder 08/29/2015  . Hypertension 08/28/2015  . Cannabis use disorder, moderate, dependence (HCC) 08/28/2015  . Knee pain 08/28/2015  . Bipolar I disorder, most recent episode depressed (HCC) 08/28/2015    History reviewed. No pertinent surgical history.  Prior to Admission medications   Medication Sig Start Date End Date Taking? Authorizing Provider  meloxicam (MOBIC) 15 MG tablet Take 1 tablet (15 mg total) by mouth daily. 08/27/15 08/26/16 Yes Tommi Rumpshonda L Javin Nong, PA-C  hydrochlorothiazide (HYDRODIURIL) 25 MG tablet Take 1 tablet (25 mg total) by mouth daily. 08/30/15   Shari ProwsJolanta B Pucilowska, MD  Oxcarbazepine (TRILEPTAL) 300 MG tablet Take 1 tablet (300 mg total) by mouth 2 (two) times daily. 08/30/15   Shari ProwsJolanta B  Pucilowska, MD  risperiDONE (RISPERDAL) 2 MG tablet Take 1 tablet (2 mg total) by mouth 2 (two) times daily. 08/30/15   Shari ProwsJolanta B Pucilowska, MD  traZODone (DESYREL) 100 MG tablet Take 1 tablet (100 mg total) by mouth at bedtime. 08/30/15   Shari ProwsJolanta B Pucilowska, MD    Allergies Review of patient's allergies indicates no known allergies.  No family history on file.  Social History Social History  Substance Use Topics  . Smoking status: Current Every Day Smoker  . Smokeless tobacco: Never Used  . Alcohol use Yes    Review of Systems Constitutional: No fever/chills Cardiovascular: Denies chest pain. Respiratory: Denies shortness of breath. Gastrointestinal: No abdominal pain.  No nausea, no vomiting.   Musculoskeletal: Negative for back pain.Positive for chronic right knee pain. Skin: Negative for rash. Neurological: Negative for headaches, focal weakness or numbness.  10-point ROS otherwise negative.  ____________________________________________   PHYSICAL EXAM:  VITAL SIGNS: ED Triage Vitals  Enc Vitals Group     BP 09/21/15 1024 132/72     Pulse Rate 09/21/15 1024 77     Resp 09/21/15 1024 20     Temp 09/21/15 1024 99.1 F (37.3 C)     Temp Source 09/21/15 1024 Oral     SpO2 09/21/15 1024 99 %     Weight 09/21/15 1025 250 lb (113.4 kg)     Height 09/21/15 1025 6\' 3"  (1.905 m)     Head Circumference --      Peak Flow --  Pain Score --      Pain Loc --      Pain Edu? --      Excl. in GC? --     Constitutional: Alert and oriented. Well appearing and in no acute distress. Eyes: Conjunctivae are normal. PERRL. EOMI. Head: Atraumatic. Nose: No congestion/rhinnorhea. Neck: No stridor.   Cardiovascular: Normal rate, regular rhythm. Grossly normal heart sounds.  Good peripheral circulation. Respiratory: Normal respiratory effort.  No retractions. Lungs CTAB. Musculoskeletal: Right knee no gross deformity is noted. There is no effusion present and no soft tissue  swelling noted. There is no evidence of warmth or redness. No crepitus is appreciated. On posterior exam there is no soft tissue swelling or bulging. There is no evidence of a Baker's cyst. Patient is able to bear weight on his right leg. Neurologic:  Normal speech and language. No gross focal neurologic deficits are appreciated. No gait instability. Skin:  Skin is warm, dry and intact. No rash noted. Psychiatric: Mood and affect are normal. Speech and behavior are normal.  ____________________________________________   LABS (all labs ordered are listed, but only abnormal results are displayed)  Labs Reviewed - No data to display  RADIOLOGY Knee x-ray from 08/27/15 was reviewed.  ____________________________________________   PROCEDURES  Procedure(s) performed: None  Procedures  Critical Care performed: No  ____________________________________________   INITIAL IMPRESSION / ASSESSMENT AND PLAN / ED COURSE  Pertinent labs & imaging results that were available during my care of the patient were reviewed by me and considered in my medical decision making (see chart for details).    Clinical Course   Patient is to begin taking meloxicam 1 daily. He is follow-up with Dr. Joice Lofts if any continued knee problems.  ____________________________________________   FINAL CLINICAL IMPRESSION(S) / ED DIAGNOSES  Final diagnoses:  Chronic pain of right knee      NEW MEDICATIONS STARTED DURING THIS VISIT:  Discharge Medication List as of 09/21/2015 10:56 AM       Note:  This document was prepared using Dragon voice recognition software and may include unintentional dictation errors.    Tommi Rumps, PA-C 09/21/15 1324    Jene Every, MD 09/21/15 [phone]

## 2015-09-21 NOTE — Discharge Instructions (Signed)
Call to get an appointment Dr. Joice LoftsPoggi. Begin taking your meloxicam once a day every day. You may use your knee immobilizer from your last visit for support as needed.

## 2015-10-23 ENCOUNTER — Other Ambulatory Visit: Payer: Self-pay | Admitting: Emergency Medicine

## 2016-12-31 ENCOUNTER — Encounter: Payer: Self-pay | Admitting: Emergency Medicine

## 2016-12-31 ENCOUNTER — Emergency Department
Admission: EM | Admit: 2016-12-31 | Discharge: 2016-12-31 | Disposition: A | Discharge status: Home / Self Care | Payer: Self-pay | Attending: Emergency Medicine | Admitting: Emergency Medicine

## 2016-12-31 DIAGNOSIS — Z79899 Other long term (current) drug therapy: Secondary | ICD-10-CM | POA: Insufficient documentation

## 2016-12-31 DIAGNOSIS — M7918 Myalgia, other site: Secondary | ICD-10-CM

## 2016-12-31 DIAGNOSIS — F17210 Nicotine dependence, cigarettes, uncomplicated: Secondary | ICD-10-CM | POA: Insufficient documentation

## 2016-12-31 DIAGNOSIS — I10 Essential (primary) hypertension: Secondary | ICD-10-CM | POA: Insufficient documentation

## 2016-12-31 DIAGNOSIS — M791 Myalgia, unspecified site: Secondary | ICD-10-CM | POA: Insufficient documentation

## 2016-12-31 MED ORDER — MELOXICAM 15 MG PO TABS: 15.0000 mg | ORAL_TABLET | Freq: Every day | ORAL | 2 refills | Status: AC

## 2016-12-31 NOTE — ED Notes (Signed)
Patient has several complaints of pain, including back pain.

## 2016-12-31 NOTE — Discharge Instructions (Signed)
Follow-up with your regular doctor for your depression needs, return to the emergency department if you are worsening, take the Mobic 15 mg daily for pain

## 2016-12-31 NOTE — ED Triage Notes (Signed)
Patient presents to ED via POV from home with c/o neck pain. Patient denies any recent injury or trauma. Patient states, "I think its my arthritis acting up". Patient ambulatory to triage.

## 2016-12-31 NOTE — ED Provider Notes (Signed)
[facility] Emergency Department Provider Note  ____________________________________________   First MD Initiated Contact with Patient 12/31/16 1152     (approximate)  I have reviewed the triage vital signs and the nursing notes.   HISTORY  Chief Complaint Neck Pain    HPI Philip Norris is a 26 y.o. male patient states he just hurts all over, he has hurt his whole entire life all over, he is not taking any medications for this he is not taking his Risperdal as instructed, his mother states she is taking them to behavioral health when she leaves here, he cannot dedicate 1 area of pain, he states his hands will swell on and off ago cold on and off, he denies any injuries  Past Medical History:  Diagnosis Date  . Anxiety   . Bipolar affect, depressed (HCC)   . Depression   . Hypertension     Patient Active Problem List   Diagnosis Date Noted  . Tobacco use disorder 08/29/2015  . Hypertension 08/28/2015  . Cannabis use disorder, moderate, dependence (HCC) 08/28/2015  . Knee pain 08/28/2015  . Bipolar I disorder, most recent episode depressed (HCC) 08/28/2015    History reviewed. No pertinent surgical history.  Prior to Admission medications   Medication Sig Start Date End Date Taking? Authorizing Provider  hydrochlorothiazide (HYDRODIURIL) 25 MG tablet Take 1 tablet (25 mg total) by mouth daily. 08/30/15   Pucilowska, Braulio ConteJolanta B, MD  meloxicam (MOBIC) 15 MG tablet Take 1 tablet (15 mg total) by mouth daily. 12/31/16 12/31/17  Fisher, Roselyn BeringSusan W, PA-C  Oxcarbazepine (TRILEPTAL) 300 MG tablet Take 1 tablet (300 mg total) by mouth 2 (two) times daily. 08/30/15   Pucilowska, Jolanta B, MD  risperiDONE (RISPERDAL) 2 MG tablet Take 1 tablet (2 mg total) by mouth 2 (two) times daily. 08/30/15   Pucilowska, Braulio ConteJolanta B, MD  traZODone (DESYREL) 100 MG tablet Take 1 tablet (100 mg total) by mouth at bedtime. 08/30/15   Pucilowska, Ellin GoodieJolanta B, MD     Allergies Patient has no known allergies.  No family history on file.  Social History Social History   Tobacco Use  . Smoking status: Current Every Day Smoker  . Smokeless tobacco: Never Used  Substance Use Topics  . Alcohol use: Yes  . Drug use: Not on file    Review of Systems  Constitutional: No fever/chills Eyes: No visual changes. ENT: No sore throat. Respiratory: Denies cough Genitourinary: Negative for dysuria. Musculoskeletal: Positive for joint and for back pain. Skin: Negative for rash.    ____________________________________________   PHYSICAL EXAM:  VITAL SIGNS: ED Triage Vitals  Enc Vitals Group     BP 12/31/16 1056 (!) 152/59     Pulse Rate 12/31/16 1056 86     Resp 12/31/16 1056 16     Temp 12/31/16 1056 98.3 F (36.8 C)     Temp Source 12/31/16 1056 Oral     SpO2 12/31/16 1056 100 %     Weight 12/31/16 1056 220 lb (99.8 kg)     Height 12/31/16 1056 6\' 3"  (1.905 m)     Head Circumference --      Peak Flow --      Pain Score 12/31/16 1131 7     Pain Loc --      Pain Edu? --      Excl. in GC? --     Constitutional: Alert and oriented. Well appearing and in no acute distress. Eyes: Conjunctivae are normal.  Head:  Atraumatic. Nose: No congestion/rhinnorhea. Mouth/Throat: Mucous membranes are moist.   Cardiovascular: Normal rate, regular rhythm.  Heart sounds are normal Respiratory: Normal respiratory effort.  No retractions lungs are clear to auscultation GU: deferred Musculoskeletal: FROM all extremities, warm and well perfused, I cannot pinpoint any areas that are tender Neurologic:  Normal speech and language.  Skin:  Skin is warm, dry and intact. No rash noted. Psychiatric: Mood and affect are normal. Speech and behavior are normal.  ____________________________________________   LABS (all labs ordered are listed, but only abnormal results are displayed)  Labs Reviewed - No data to  display ____________________________________________   ____________________________________________  RADIOLOGY    ____________________________________________   PROCEDURES  Procedure(s) performed: No      ____________________________________________   INITIAL IMPRESSION / ASSESSMENT AND PLAN / ED COURSE  Pertinent labs & imaging results that were available during my care of the patient were reviewed by me and considered in my medical decision making (see chart for details).  Patient is a 26 year old male complaining of pain all over, he cannot dedicate one area to specify the pain, he states he is not hurting right now but his arthritis flares up and he hurts all over all the time, his mother is with him and she is concerned because he has not taken his depression medicine but she will be taking him to behavioral health when they leave here, physical exam is normal, patient was given a prescription for meloxicam 15 mg daily for intermittent joint pain, they are to follow-up with the behavioral health physician, they are to return if worsening, patient was discharged in stable condition      ____________________________________________   FINAL CLINICAL IMPRESSION(S) / ED DIAGNOSES  Final diagnoses:  Musculoskeletal pain      NEW MEDICATIONS STARTED DURING THIS VISIT:  This SmartLink is deprecated. Use AVSMEDLIST instead to display the medication list for a patient.   Note:  This document was prepared using Dragon voice recognition software and may include unintentional dictation errors.    Philip Norris, Susan W, PA-C 12/31/16 1440    Philip Norris, Philip E, MD 12/31/16 [phone]

## 2016-12-31 NOTE — ED Notes (Signed)
FIRST NURSE NOTE:  Pt c/o multiple areas of pain-back, neck and leg. Ambulatory in lobby without difficulty.

## 2017-05-18 ENCOUNTER — Encounter: Payer: Self-pay | Admitting: Emergency Medicine

## 2017-05-18 ENCOUNTER — Emergency Department
Admission: EM | Admit: 2017-05-18 | Discharge: 2017-05-18 | Disposition: A | Discharge status: Home / Self Care | Payer: Self-pay | Attending: Emergency Medicine | Admitting: Emergency Medicine

## 2017-05-18 ENCOUNTER — Emergency Department: Payer: Self-pay

## 2017-05-18 DIAGNOSIS — Z79899 Other long term (current) drug therapy: Secondary | ICD-10-CM | POA: Insufficient documentation

## 2017-05-18 DIAGNOSIS — I10 Essential (primary) hypertension: Secondary | ICD-10-CM | POA: Insufficient documentation

## 2017-05-18 DIAGNOSIS — F17200 Nicotine dependence, unspecified, uncomplicated: Secondary | ICD-10-CM | POA: Insufficient documentation

## 2017-05-18 DIAGNOSIS — M25512 Pain in left shoulder: Secondary | ICD-10-CM | POA: Insufficient documentation

## 2017-05-18 DIAGNOSIS — G8929 Other chronic pain: Secondary | ICD-10-CM | POA: Insufficient documentation

## 2017-05-18 LAB — CBC
HCT: 45.9 % (ref 40.0–52.0)
Hemoglobin: 14.7 g/dL (ref 13.0–18.0)
MCH: 27.7 pg (ref 26.0–34.0)
MCHC: 32 g/dL (ref 32.0–36.0)
MCV: 86.6 fL (ref 80.0–100.0)
PLATELETS: 276 10*3/uL (ref 150–440)
RBC: 5.3 MIL/uL (ref 4.40–5.90)
RDW: 13.5 % (ref 11.5–14.5)
WBC: 6.1 10*3/uL (ref 3.8–10.6)

## 2017-05-18 LAB — BASIC METABOLIC PANEL
Anion gap: 9 (ref 5–15)
BUN: 13 mg/dL (ref 6–20)
CHLORIDE: 101 mmol/L (ref 101–111)
CO2: 29 mmol/L (ref 22–32)
CREATININE: 1.04 mg/dL (ref 0.61–1.24)
Calcium: 10.1 mg/dL (ref 8.9–10.3)
GFR calc Af Amer: >60 mL/min (ref >60)
GFR calc non Af Amer: >60 mL/min (ref >60)
GLUCOSE: 104 mg/dL — AB (ref 65–99)
Potassium: 4.4 mmol/L (ref 3.5–5.1)
Sodium: 139 mmol/L (ref 135–145)

## 2017-05-18 LAB — TROPONIN I: Troponin I: <0.03 ng/mL (ref <0.03)

## 2017-05-18 MED ORDER — IBUPROFEN 600 MG PO TABS: 600.0000 mg | ORAL_TABLET | Freq: Four times a day (QID) | ORAL | 0 refills | Status: AC | PRN

## 2017-05-18 MED ORDER — IBUPROFEN 400 MG PO TABS
600.0000 mg | ORAL_TABLET | Freq: Once | ORAL | Status: AC
  Administered 2017-05-18: 600 mg via ORAL
  Filled 2017-05-18: qty 2

## 2017-05-18 NOTE — ED Provider Notes (Signed)
[facility] Emergency Department Provider Note  ____________________________________________  Time seen: Approximately 3:51 PM  I have reviewed the triage vital signs and the nursing notes.   HISTORY  Chief Complaint Chest Pain   HPI Philip Norris is a 27 y.o. male with a history of bipolar, anxiety, hypertension, smoking who presents for evaluation of left shoulder pain.  Patient reports that he has had this shoulder pain for several years.  3 days ago he was trying to stretch it in the bathroom because it was hurting and he felt a pop in the shoulder.  Since then the pain has become more pronounced.  He is complaining of 10 out of 10 sharp pain located in his left shoulder and left shoulder blade that is constant, and worse with movement of the arm especially when the arm is elevated on top of his head.  He reports that the pain also radiates to the left side of his chest.  No shortness of breath, dizziness, nausea, vomiting.  No family history of arthritis or heart attacks.  Patient reports taking Tylenol at home for the pain with no relief.  Past Medical History:  Diagnosis Date  . Anxiety   . Bipolar affect, depressed (HCC)   . Depression   . Hypertension     Patient Active Problem List   Diagnosis Date Noted  . Tobacco use disorder 08/29/2015  . Hypertension 08/28/2015  . Cannabis use disorder, moderate, dependence (HCC) 08/28/2015  . Knee pain 08/28/2015  . Bipolar I disorder, most recent episode depressed (HCC) 08/28/2015    History reviewed. No pertinent surgical history.  Prior to Admission medications   Medication Sig Start Date End Date Taking? Authorizing Provider  hydrochlorothiazide (HYDRODIURIL) 25 MG tablet Take 1 tablet (25 mg total) by mouth daily. 08/30/15   Pucilowska, Braulio Conte B, MD  ibuprofen (ADVIL,MOTRIN) 600 MG tablet Take 1 tablet (600 mg total) by mouth every 6 (six) hours as needed. 05/18/17   Nita Sickle, MD    meloxicam (MOBIC) 15 MG tablet Take 1 tablet (15 mg total) by mouth daily. 12/31/16 12/31/17  Fisher, Roselyn Bering, PA-C  Oxcarbazepine (TRILEPTAL) 300 MG tablet Take 1 tablet (300 mg total) by mouth 2 (two) times daily. 08/30/15   Pucilowska, Jolanta B, MD  risperiDONE (RISPERDAL) 2 MG tablet Take 1 tablet (2 mg total) by mouth 2 (two) times daily. 08/30/15   Pucilowska, Braulio Conte B, MD  traZODone (DESYREL) 100 MG tablet Take 1 tablet (100 mg total) by mouth at bedtime. 08/30/15   Pucilowska, Ellin Goodie, MD    Allergies Patient has no known allergies.  No family history on file.  Social History Social History   Tobacco Use  . Smoking status: Current Every Day Smoker  . Smokeless tobacco: Never Used  Substance Use Topics  . Alcohol use: Yes  . Drug use: Not on file    Review of Systems  Constitutional: Negative for fever. Eyes: Negative for visual changes. ENT: Negative for sore throat. Neck: No neck pain  Cardiovascular: Negative for chest pain. Respiratory: Negative for shortness of breath. Gastrointestinal: Negative for abdominal pain, vomiting or diarrhea. Genitourinary: Negative for dysuria. Musculoskeletal: Negative for back pain. + L shoulder pain Skin: Negative for rash. Neurological: Negative for headaches, weakness or numbness. Psych: No SI or HI  ____________________________________________   PHYSICAL EXAM:  VITAL SIGNS: ED Triage Vitals  Enc Vitals Group     BP 05/18/17 1428 (!) 149/93     Pulse Rate 05/18/17 1428  88     Resp 05/18/17 1428 16     Temp 05/18/17 1428 98.8 F (37.1 C)     Temp src --      SpO2 05/18/17 1428 99 %     Weight 05/18/17 1427 247 lb (112 kg)     Height 05/18/17 1427  (1.905 m)     Head Circumference --      Peak Flow --      Pain Score 05/18/17 1427 7     Pain Loc --      Pain Edu? --      Excl. in GC? --     Constitutional: Alert and oriented. Well appearing and in no apparent distress. HEENT:      Head: Normocephalic  and atraumatic.         Eyes: Conjunctivae are normal. Sclera is non-icteric.       Mouth/Throat: Mucous membranes are moist.       Neck: Supple with no signs of meningismus. Cardiovascular: Regular rate and rhythm. No murmurs, gallops, or rubs. 2+ symmetrical distal pulses are present in all extremities. No JVD. Respiratory: Normal respiratory effort. Lungs are clear to auscultation bilaterally. No wheezes, crackles, or rhonchi.  Gastrointestinal: Soft, non tender, and non distended with positive bowel sounds. No rebound or guarding. Musculoskeletal: Range of motion is intact however pain is reproducible with full extension of the shoulder, there is no warmth or erythema, there is no midline CT and L-spine tenderness, patient has strong distal pulses intact strength and sensation of the upper extremities.  Pain is also reproduced with palpation of the posterior aspect of the left shoulder  Neurologic: Normal speech and language. Face is symmetric. Moving all extremities. No gross focal neurologic deficits are appreciated. Skin: Skin is warm, dry and intact. No rash noted. Psychiatric: Mood and affect are normal. Speech and behavior are normal.  ____________________________________________   LABS (all labs ordered are listed, but only abnormal results are displayed)  Labs Reviewed  BASIC METABOLIC PANEL - Abnormal; Notable for the following components:      Result Value   Glucose, Bld 104 (*)    All other components within normal limits  CBC  TROPONIN I   ____________________________________________  EKG  ED ECG REPORT I, Nita Sickle, the attending physician, personally viewed and interpreted this ECG.  Normal sinus rhythm, rate of 82, normal intervals, normal axis, diffuse T wave flattening, no ST elevations or depressions. No prior for comparison ____________________________________________  RADIOLOGY  I have personally reviewed the images performed during this visit and  I agree with the Radiologist's read.   Interpretation by Radiologist:  Dg Chest 2 View  Result Date: 05/18/2017 CLINICAL DATA:  LEFT-sided chest pain, aching. EXAM: CHEST - 2 VIEW COMPARISON:  None. FINDINGS: The heart size and mediastinal contours are within normal limits. Both lungs are clear. The visualized skeletal structures are unremarkable. IMPRESSION: No active cardiopulmonary disease. Electronically Signed   By: Elsie Stain M.D.   On: 05/18/2017 15:23   Dg Shoulder Left  Result Date: 05/18/2017 CLINICAL DATA:  LEFT shoulder pain for a few days with no known injury. EXAM: LEFT SHOULDER - 2+ VIEW COMPARISON:  None. FINDINGS: There is no evidence of fracture or dislocation. There is no evidence of arthropathy or other focal bone abnormality. Soft tissues are unremarkable. IMPRESSION: Negative. Electronically Signed   By: Elsie Stain M.D.   On: 05/18/2017 16:25     ____________________________________________   PROCEDURES  Procedure(s) performed: None  Procedures Critical Care performed:  None ____________________________________________   INITIAL IMPRESSION / ASSESSMENT AND PLAN / ED COURSE  27 y.o. male with a history of bipolar, anxiety, hypertension, smoking who presents for evaluation of left shoulder pain x several years but worse 3 days in the setting of a popping sensation while trying to stretch the shoulder at home.  Pain is reproducible with elevation of the arm and palpation of the posterior aspect of the shoulder, there is no obvious signs of dislocation or fracture.  There is no weakness or numbness, strong distal pulses, no rashes.  EKG was done in triage which shows diffuse T wave flattening but no evidence of acute ischemia.  Troponin is negative.  Chest x-ray is negative.  We will send patient for an x-ray of his shoulder to evaluate for arthritis.  Will start patient on Motrin. Ddx arthritis vs rotator cuff pathology. If XR negative will refer for orthopedics.     _________________________ 4:35 PM on 05/18/2017 -----------------------------------------  XR negative for arthritis, DJD, fracture or dislocation. Will dc home on NSAIDs and referral to orthopedics.    As part of my medical decision making, I reviewed the following data within the electronic MEDICAL RECORD NUMBER Nursing notes reviewed and incorporated, Labs reviewed , EKG interpreted , Old chart reviewed, Radiograph reviewed , Notes from prior ED visits and [place] Controlled Substance Database    Pertinent labs & imaging results that were available during my care of the patient were reviewed by me and considered in my medical decision making (see chart for details).    ____________________________________________   FINAL CLINICAL IMPRESSION(S) / ED DIAGNOSES  Final diagnoses:  Chronic left shoulder pain      NEW MEDICATIONS STARTED DURING THIS VISIT:  ED Discharge Orders        Ordered    ibuprofen (ADVIL,MOTRIN) 600 MG tablet  Every 6 hours PRN     05/18/17 1635       Note:  This document was prepared using Dragon voice recognition software and may include unintentional dictation errors.    Don Perking, [place], MD 05/18/17 [phone]

## 2017-05-18 NOTE — ED Triage Notes (Signed)
Pt arrived with complaints of left sided chest pain that radiate to left shoulder. Pt states the pain is a aching pressure that is amplified with movement. Pt states this started a few days ago. Pt in NAD in triage.

## 2018-01-26 ENCOUNTER — Other Ambulatory Visit: Payer: Self-pay

## 2018-01-26 ENCOUNTER — Emergency Department
Admission: EM | Admit: 2018-01-26 | Discharge: 2018-01-26 | Disposition: A | Discharge status: Home / Self Care | Payer: Self-pay | Attending: Emergency Medicine | Admitting: Emergency Medicine

## 2018-01-26 DIAGNOSIS — X58XXXA Exposure to other specified factors, initial encounter: Secondary | ICD-10-CM | POA: Insufficient documentation

## 2018-01-26 DIAGNOSIS — Z79899 Other long term (current) drug therapy: Secondary | ICD-10-CM | POA: Insufficient documentation

## 2018-01-26 DIAGNOSIS — I10 Essential (primary) hypertension: Secondary | ICD-10-CM | POA: Insufficient documentation

## 2018-01-26 DIAGNOSIS — F17200 Nicotine dependence, unspecified, uncomplicated: Secondary | ICD-10-CM | POA: Insufficient documentation

## 2018-01-26 DIAGNOSIS — Y999 Unspecified external cause status: Secondary | ICD-10-CM | POA: Insufficient documentation

## 2018-01-26 DIAGNOSIS — Y929 Unspecified place or not applicable: Secondary | ICD-10-CM | POA: Insufficient documentation

## 2018-01-26 DIAGNOSIS — S0501XA Injury of conjunctiva and corneal abrasion without foreign body, right eye, initial encounter: Secondary | ICD-10-CM | POA: Insufficient documentation

## 2018-01-26 DIAGNOSIS — Y939 Activity, unspecified: Secondary | ICD-10-CM | POA: Insufficient documentation

## 2018-01-26 MED ORDER — TETRACAINE HCL 0.5 % OP SOLN
1.0000 [drp] | Freq: Once | OPHTHALMIC | Status: AC
  Administered 2018-01-26: 1 [drp] via OPHTHALMIC
  Filled 2018-01-26: qty 4

## 2018-01-26 MED ORDER — EYE WASH OPHTH SOLN
1.0000 [drp] | OPHTHALMIC | Status: DC | PRN
  Filled 2018-01-26: qty 118

## 2018-01-26 MED ORDER — GENTAMICIN SULFATE 0.3 % OP SOLN: 1.0000 [drp] | OPHTHALMIC | 0 refills | Status: AC

## 2018-01-26 MED ORDER — FLUORESCEIN SODIUM 1 MG OP STRP
1.0000 | ORAL_STRIP | Freq: Once | OPHTHALMIC | Status: AC
  Administered 2018-01-26: 1 via OPHTHALMIC
  Filled 2018-01-26: qty 1

## 2018-01-26 NOTE — ED Triage Notes (Signed)
Pt A&O, ambulatory.   States R eye blurry and burning. States it's been watering. Wears glasses. States symptoms x 2 months. Does not have an eye doctor.

## 2018-01-26 NOTE — ED Provider Notes (Signed)
[facility] Emergency Department Provider Note   ____________________________________________   First MD Initiated Contact with Patient 01/26/18 1321     (approximate)  I have reviewed the triage vital signs and the nursing notes.   HISTORY  Chief Complaint Eye Problem    HPI Philip Norris is a 28 y.o. male patient complained of blurry vision right eye for greater than 2 months.  Patient states intermittent tearing of the right eye.   Patient wear corrective lenses but said his prescription is greater than 29 years old.  Patient state foreign body sensation of the right eye for approximately 2 months.    Past Medical History:  Diagnosis Date  . Anxiety   . Bipolar affect, depressed (HCC)   . Depression   . Hypertension     Patient Active Problem List   Diagnosis Date Noted  . Tobacco use disorder 08/29/2015  . Hypertension 08/28/2015  . Cannabis use disorder, moderate, dependence (HCC) 08/28/2015  . Knee pain 08/28/2015  . Bipolar I disorder, most recent episode depressed (HCC) 08/28/2015    History reviewed. No pertinent surgical history.  Prior to Admission medications   Medication Sig Start Date End Date Taking? Authorizing Provider  ibuprofen (ADVIL,MOTRIN) 600 MG tablet Take 1 tablet (600 mg total) by mouth every 6 (six) hours as needed. 05/18/17  Yes Don PerkingVeronese, [place], MD  lurasidone (LATUDA) 40 MG TABS tablet Take 40 mg by mouth daily with breakfast.   Yes [provider]  gentamicin (GARAMYCIN) 0.3 % ophthalmic solution Place 1 drop into the right eye every 4 (four) hours. 01/26/18   Joni ReiningSmith, Erisha Paugh K, PA-C  hydrochlorothiazide (HYDRODIURIL) 25 MG tablet Take 1 tablet (25 mg total) by mouth daily. 08/30/15   Pucilowska, Ellin GoodieJolanta B, MD  Oxcarbazepine (TRILEPTAL) 300 MG tablet Take 1 tablet (300 mg total) by mouth 2 (two) times daily. 08/30/15   Pucilowska, Jolanta B, MD  risperiDONE (RISPERDAL) 2 MG tablet Take 1 tablet (2 mg  total) by mouth 2 (two) times daily. 08/30/15   Pucilowska, Braulio ConteJolanta B, MD  traZODone (DESYREL) 100 MG tablet Take 1 tablet (100 mg total) by mouth at bedtime. 08/30/15   Pucilowska, Ellin GoodieJolanta B, MD    Allergies Patient has no known allergies.  History reviewed. No pertinent family history.  Social History Social History   Tobacco Use  . Smoking status: Current Every Day Smoker  . Smokeless tobacco: Never Used  Substance Use Topics  . Alcohol use: Yes  . Drug use: Not on file   Patient's review of Systems Constitutional: No fever/chills Eyes: No visual changes. ENT: No sore throat. Cardiovascular: Denies chest pain. Respiratory: Denies shortness of breath. Gastrointestinal: No abdominal pain.  No nausea, no vomiting.  No diarrhea.  No constipation. Genitourinary: Negative for dysuria. Musculoskeletal: Negative for back pain. Skin: Negative for rash. Neurological: Negative for headaches, focal weakness or numbness. Psychiatric:Anxiety and depression Endocrine:Hypertension  ____________________________________________   PHYSICAL EXAM:  VITAL SIGNS: ED Triage Vitals  Enc Vitals Group     BP 01/26/18 1307 138/84     Pulse Rate 01/26/18 1307 85     Resp 01/26/18 1306 18     Temp 01/26/18 1306 98.6 F (37 C)     Temp src --      SpO2 01/26/18 1307 95 %     Weight 01/26/18 1307 265 lb (120.2 kg)     Height 01/26/18 1307 6\' 3"  (1.905 m)     Head Circumference --  Peak Flow --      Pain Score 01/26/18 1306 0     Pain Loc --      Pain Edu? --      Excl. in GC? --     Constitutional: Alert and oriented. Well appearing and in no acute distress. Eyes: See visual acuity results.  Conjunctivae are normal. PERRL. EOMI. fluoroscopy stain reveal corneal abrasion right eye. Cardiovascular: Normal rate, regular rhythm. Grossly normal heart sounds.  Good peripheral circulation. Respiratory: Normal respiratory effort.  No retractions. Lungs CTAB. Neurologic:  Normal speech  and language. No gross focal neurologic deficits are appreciated. No gait instability. Skin:  Skin is warm, dry and intact. No rash noted. Psychiatric: Mood and affect are normal. Speech and behavior are normal.  ____________________________________________   LABS (all labs ordered are listed, but only abnormal results are displayed)  Labs Reviewed - No data to display ____________________________________________  EKG   ____________________________________________  RADIOLOGY  ED MD interpretation:    Official radiology report(s): No results found.  ____________________________________________   PROCEDURES  Procedure(s) performed: None  Procedures  Critical Care performed: No  ____________________________________________   INITIAL IMPRESSION / ASSESSMENT AND PLAN / ED COURSE  As part of my medical decision making, I reviewed the following data within the electronic MEDICAL RECORD NUMBER    Decreased vision acuity and corneal abrasion right eye.  Advised patient due to the length of time of his complaint advised to follow-up with ophthalmology for definitive evaluation.      ____________________________________________   FINAL CLINICAL IMPRESSION(S) / ED DIAGNOSES  Final diagnoses:  Abrasion of right cornea, initial encounter     ED Discharge Orders         Ordered    gentamicin (GARAMYCIN) 0.3 % ophthalmic solution  Every 4 hours     01/26/18 1358           Note:  This document was prepared using Dragon voice recognition software and may include unintentional dictation errors.    Joni ReiningSmith, Johnattan Strassman K, PA-C 01/26/18 1410    Minna AntisPaduchowski, Kevin, MD 01/26/18 1434

## 2018-01-26 NOTE — Discharge Instructions (Addendum)
Due to the length of time of eye pain and corneal abrasion not treated advised follow-up with ophthalmology.  Also patient needs corrective lenses as his last prescription is 29 years old.  Use eyedrops until evaluation by ophthalmologist.

## 2018-01-26 NOTE — ED Notes (Signed)
Says right eye feels like something in it for 2 months.  No redness or drainage.  Able to keep eye open with out difficulty.  Is wearing glasses, but has not been to eye doctor in quite a while as he has not insurance now.

## 2018-01-26 NOTE — ED Notes (Signed)
Right 20/100 Left 20/40  With glasses

## 2019-10-24 ENCOUNTER — Ambulatory Visit
Admission: RE | Admit: 2019-10-24 | Discharge: 2019-10-24 | Disposition: A | Discharge status: Home / Self Care | Payer: Disability Insurance | Source: Ambulatory Visit | Attending: Internal Medicine | Admitting: Internal Medicine

## 2019-10-24 ENCOUNTER — Other Ambulatory Visit: Payer: Self-pay

## 2019-10-24 ENCOUNTER — Other Ambulatory Visit: Payer: Self-pay | Admitting: Internal Medicine

## 2019-10-24 DIAGNOSIS — M1990 Unspecified osteoarthritis, unspecified site: Secondary | ICD-10-CM | POA: Diagnosis not present

## 2022-06-06 IMAGING — CR DG SHOULDER 2+V*R*
3 series · 3 of 3 positions shown · non-contrast
Comparison: None.

CLINICAL DATA: Shoulder pain

EXAM:
RIGHT SHOULDER - 2+ VIEW

[shoulder grashey]
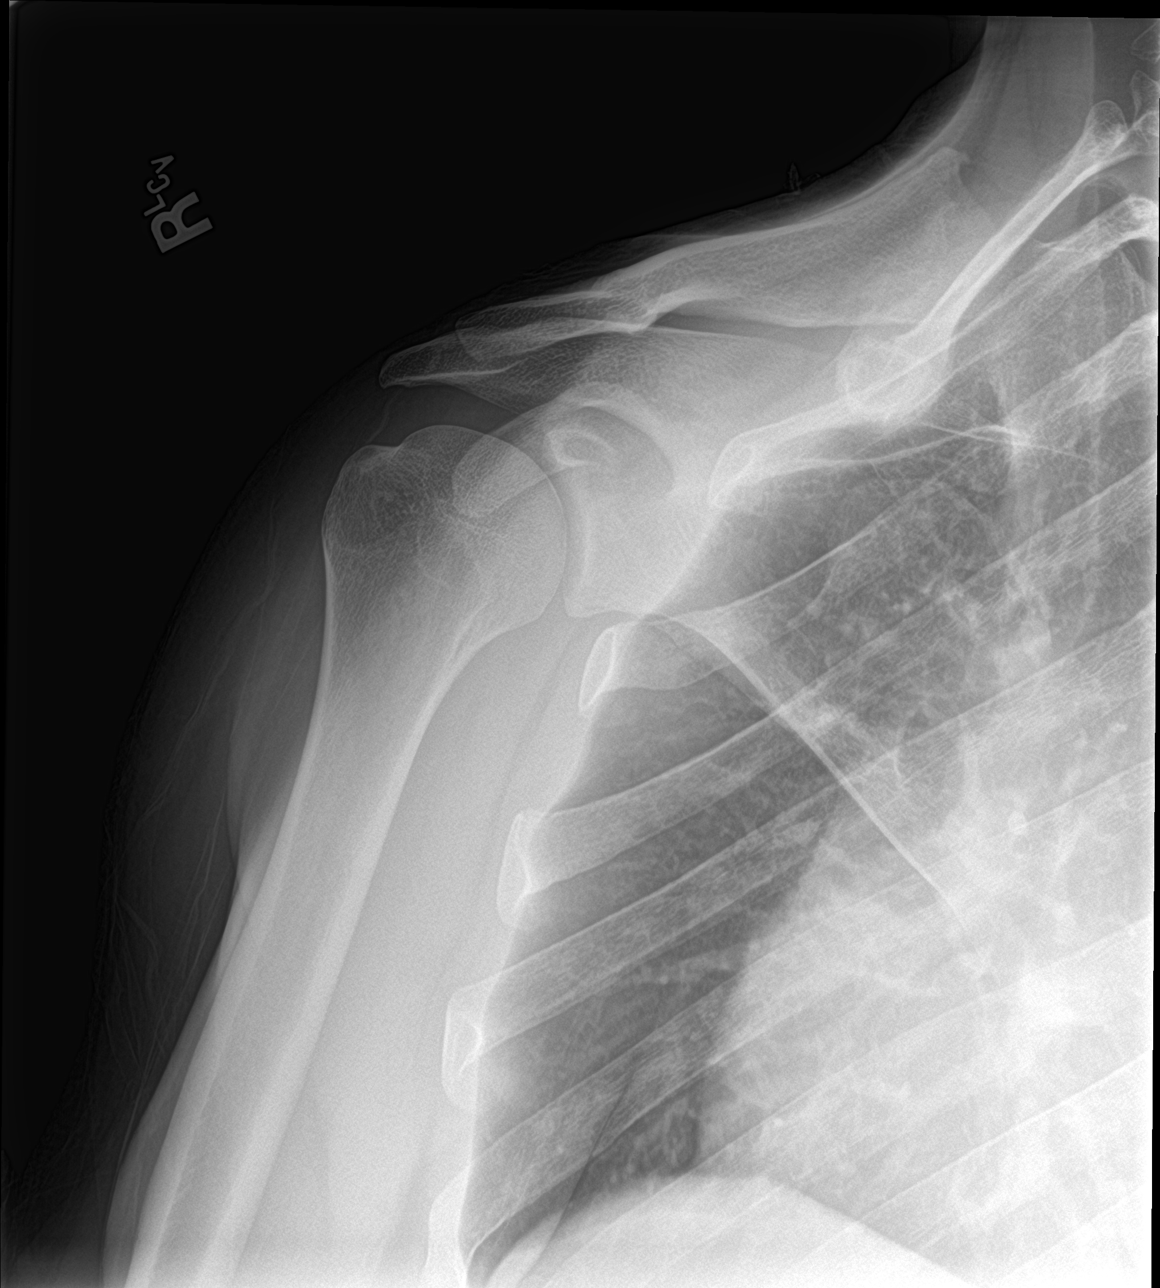

[shoulder y view]
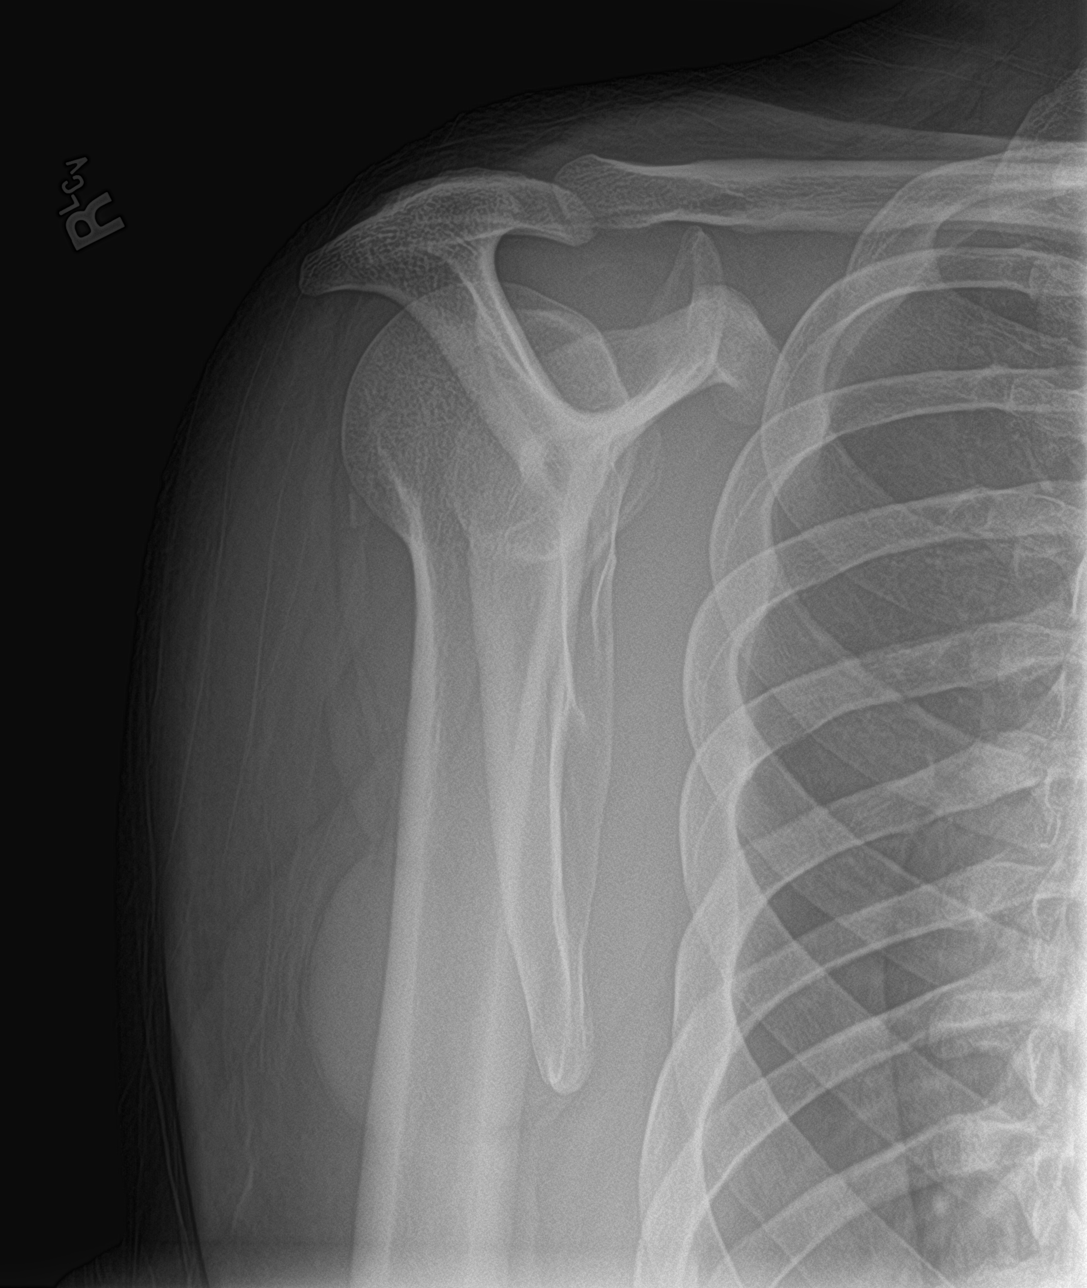

[shoulder axillary]
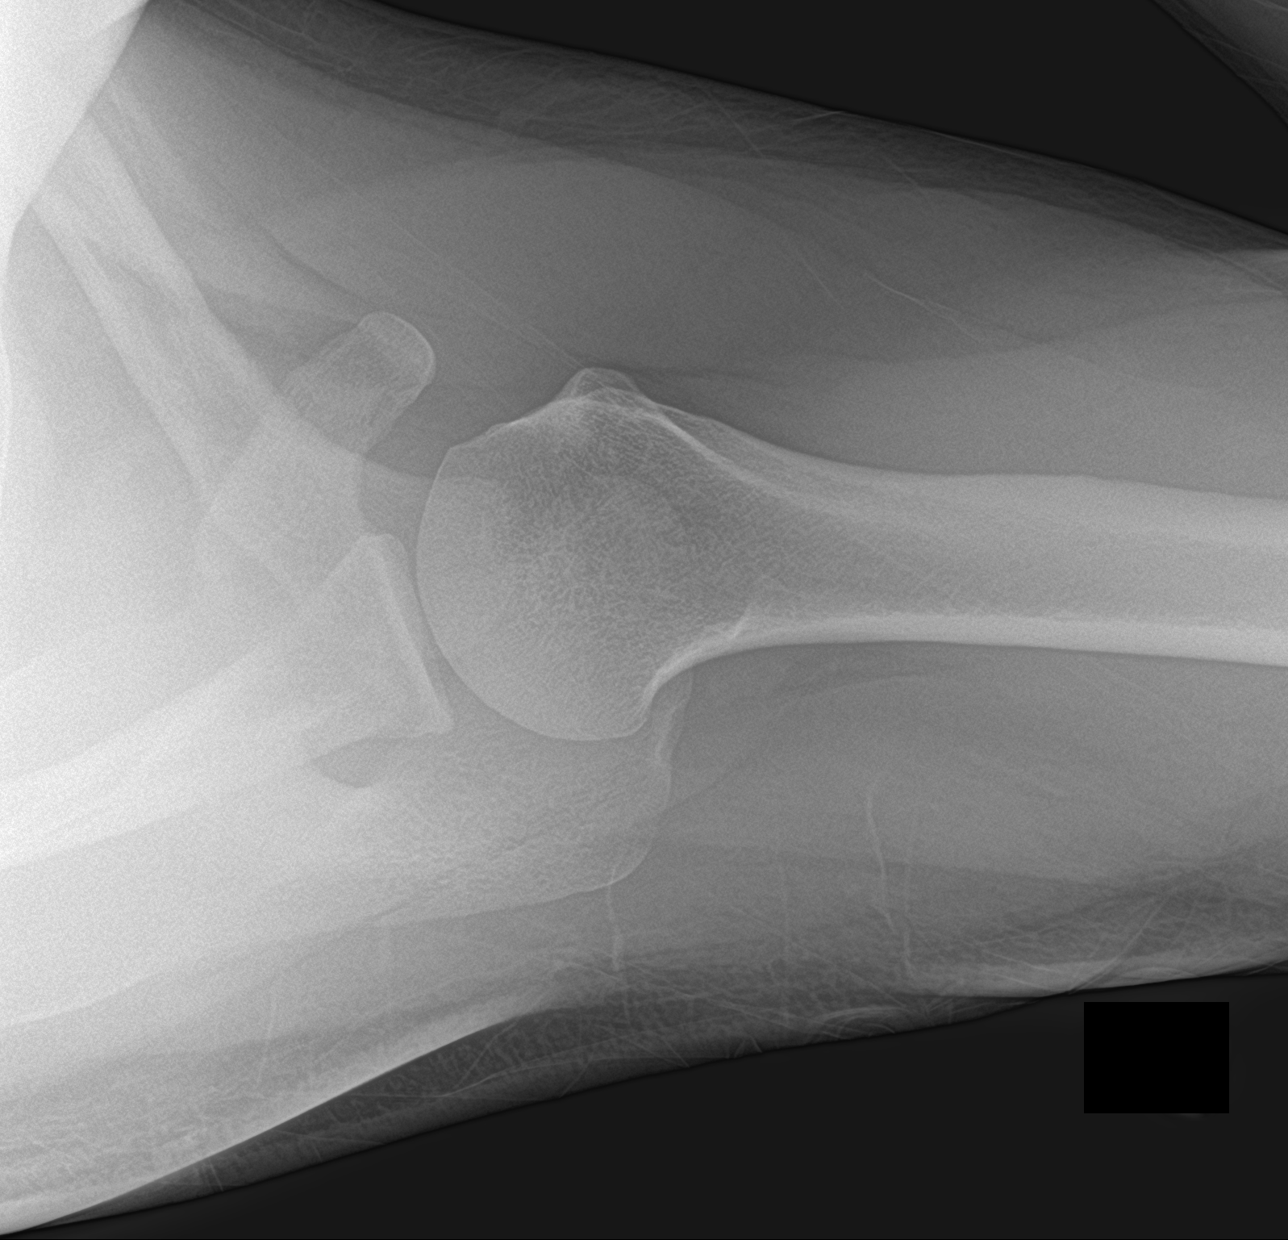

[3 of 3 positions shown; findings below may reference images not displayed]

FINDINGS: There is no evidence of fracture or dislocation. There is no
evidence of arthropathy or other focal bone abnormality. Soft
tissues are unremarkable.
IMPRESSION: Negative.

## 2022-06-06 IMAGING — CR DG CERVICAL SPINE 2 OR 3 VIEWS
3 series · 3 of 3 positions shown · non-contrast
Comparison: None.

CLINICAL DATA: Arthritis.

EXAM:
CERVICAL SPINE - 2-3 VIEW

[c-spine lat]
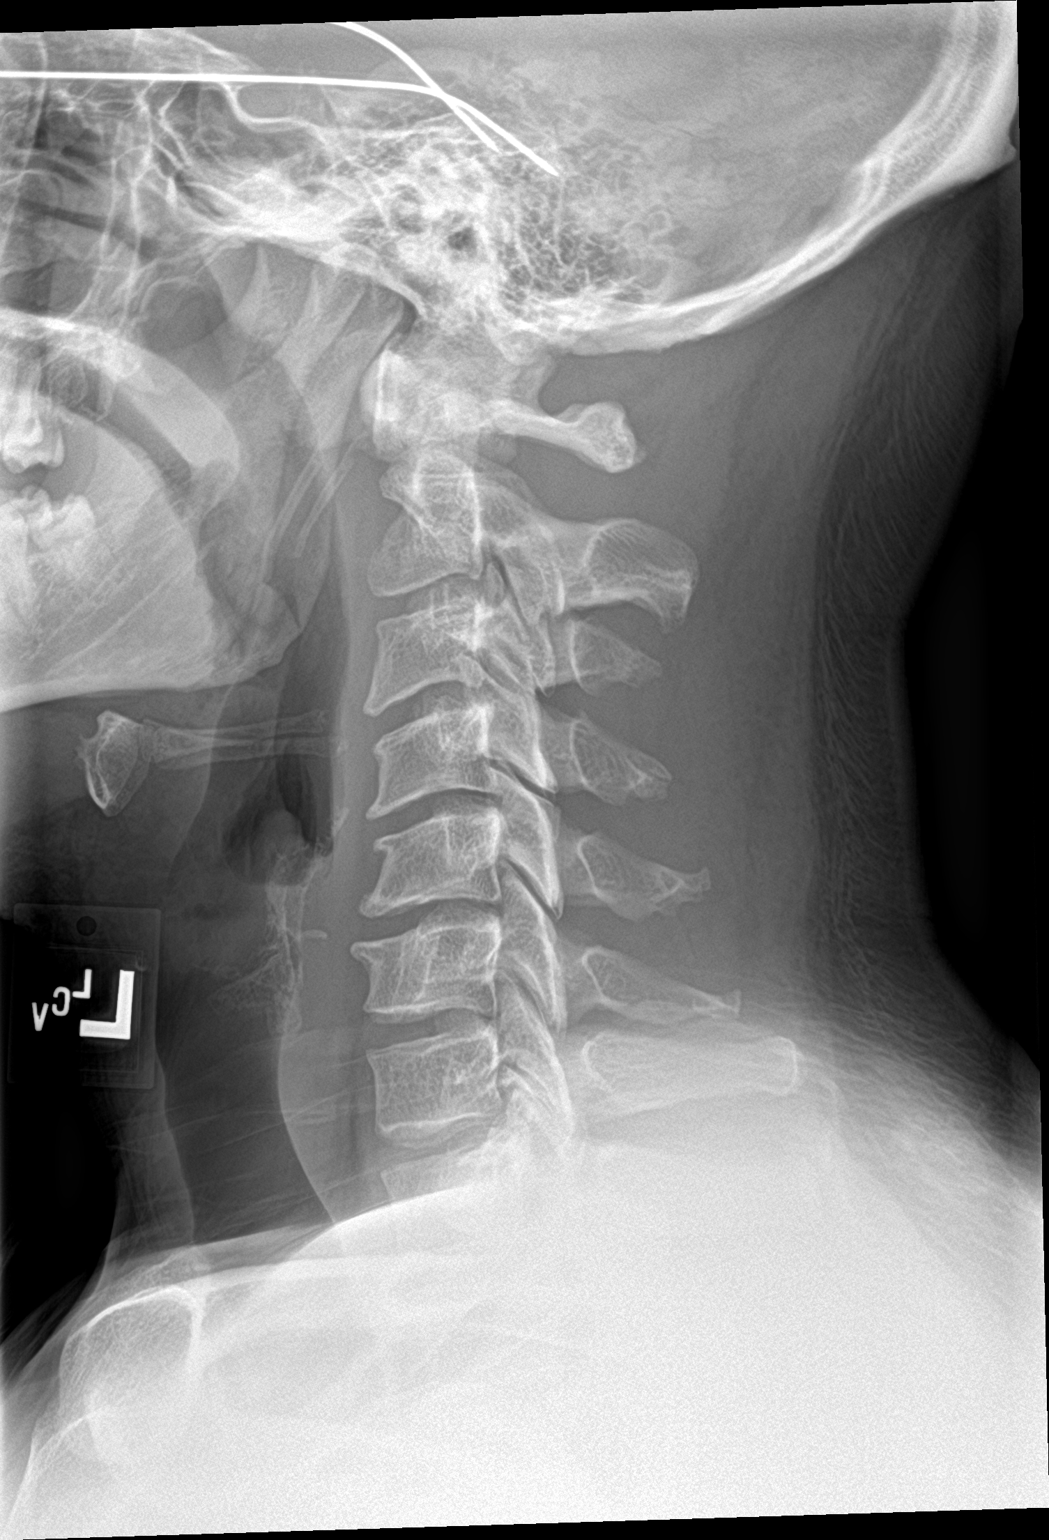

[c-spine ap]
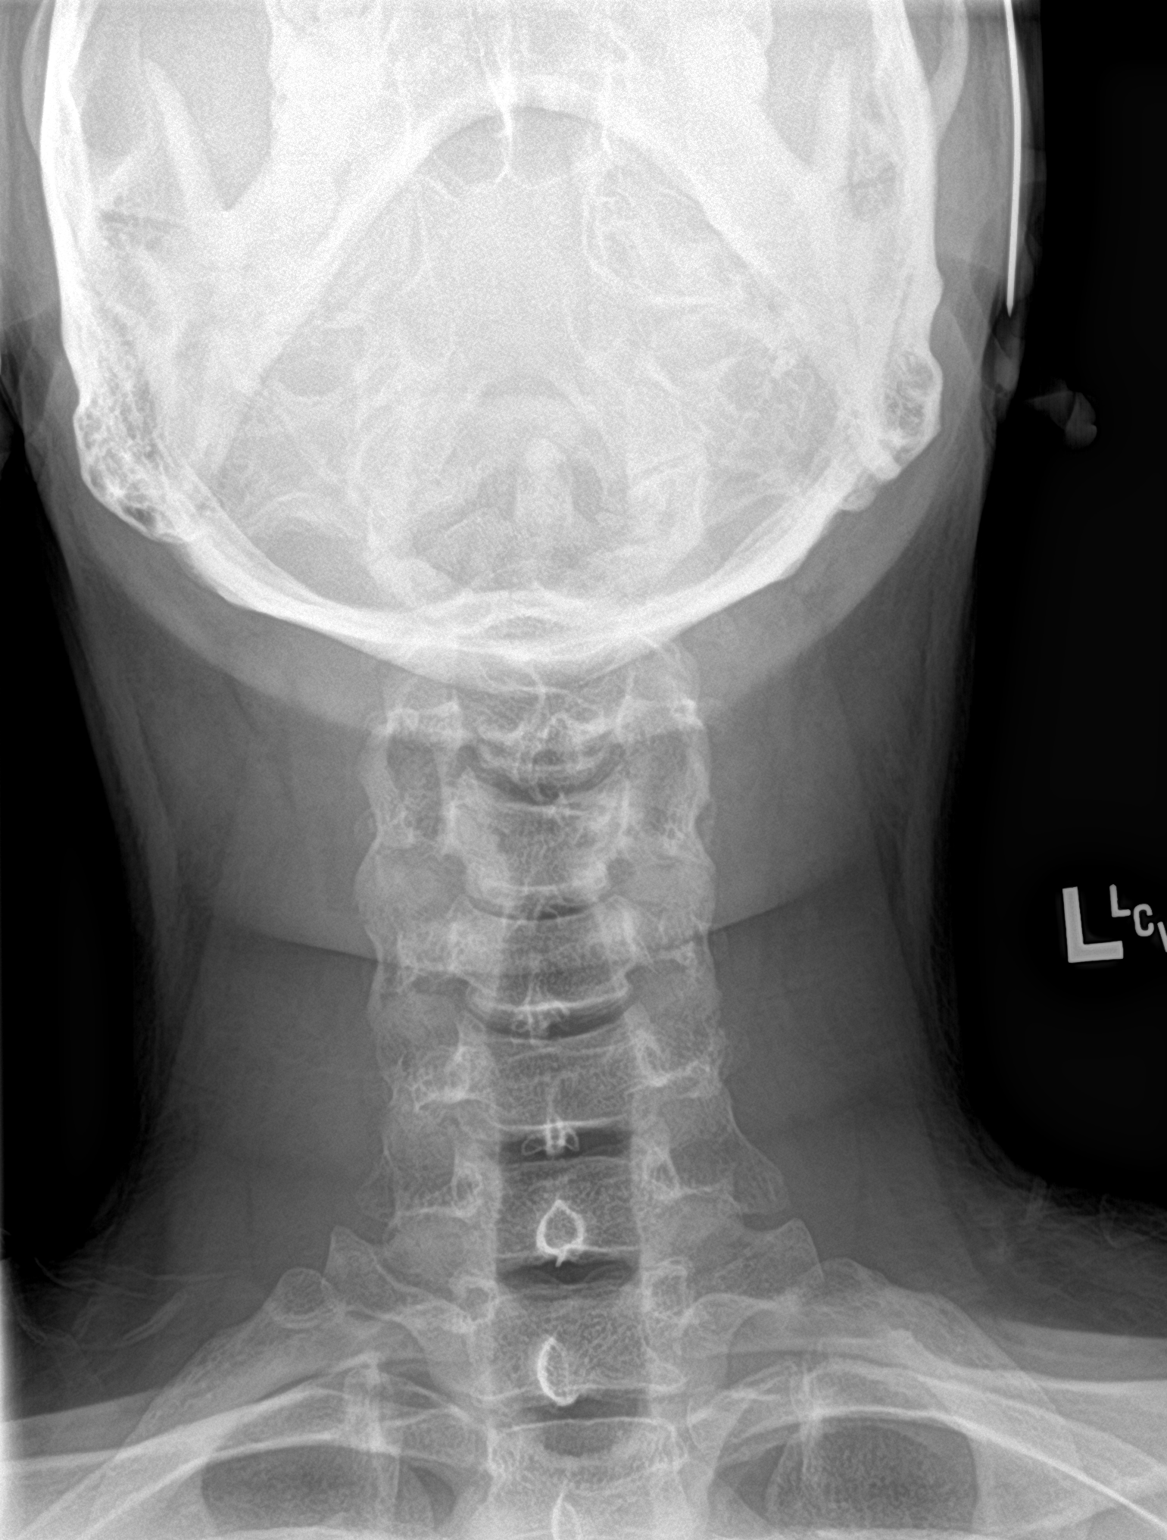

[c-spine open mouth]
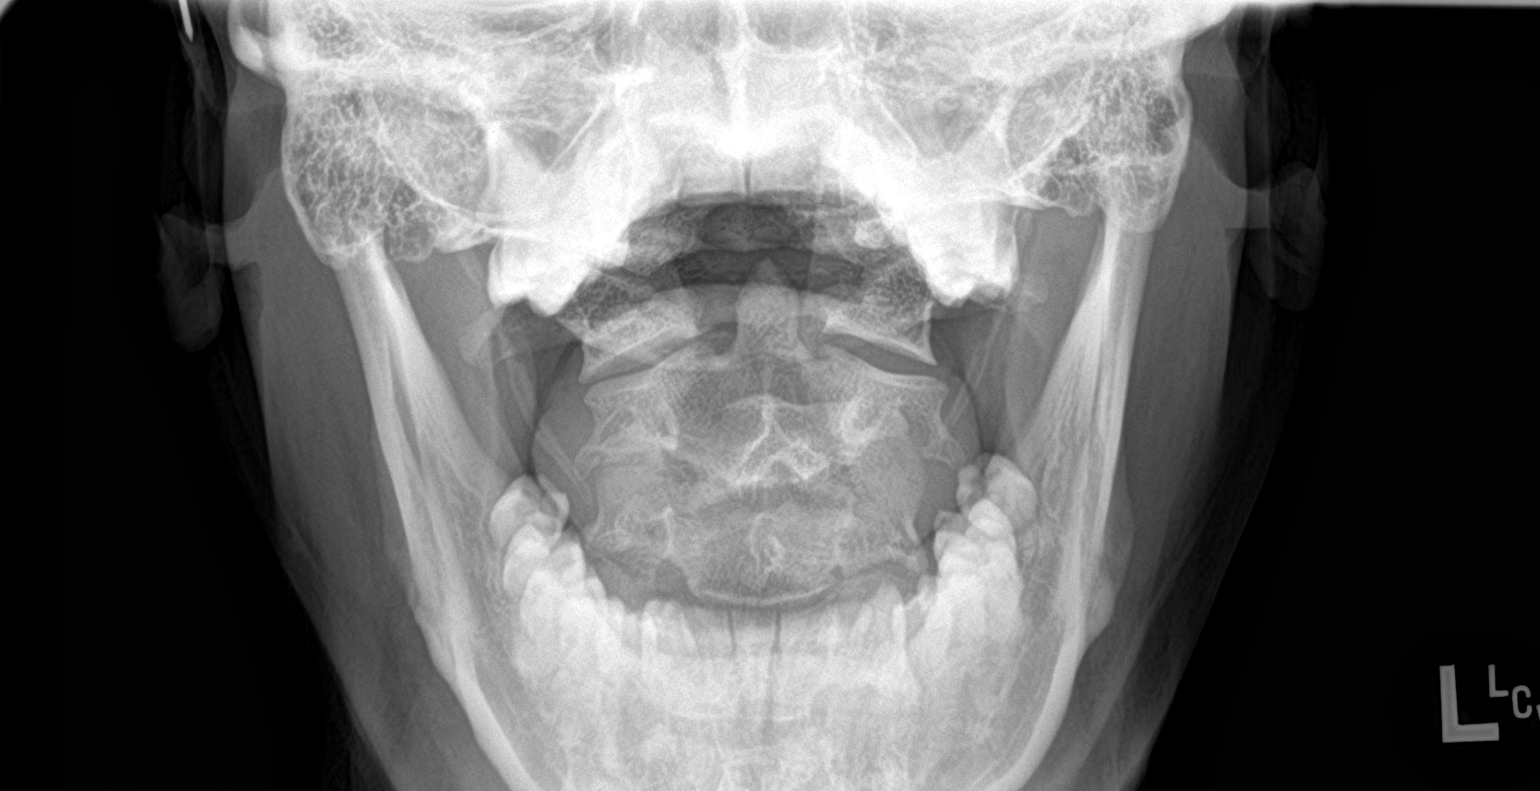

[3 of 3 positions shown; findings below may reference images not displayed]

FINDINGS: Normal alignment straightening of the cervical lordosis. No fracture
or bone lesion. Prevertebral soft tissues normal

Mild disc degeneration and spurring C4-5 and C5-6.
IMPRESSION: Mild cervical disc degeneration without acute abnormality.
